# Patient Record
Sex: Female | Born: 2000 | Race: White | Hispanic: No | Marital: Single | Smoking: Former smoker
Health system: Southern US, Community
[De-identification: ages and names within clinical notes are randomized; demographics above are authoritative.]

## PROBLEM LIST (undated history)

## (undated) ENCOUNTER — Ambulatory Visit: Admission: EM | Payer: Medicaid Other | Source: Home / Self Care

## (undated) DIAGNOSIS — Z789 Other specified health status: Secondary | ICD-10-CM

## (undated) HISTORY — DX: Other specified health status: Z78.9

---
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
## 2018-07-03 ENCOUNTER — Other Ambulatory Visit: Payer: Self-pay

## 2018-07-03 ENCOUNTER — Encounter: Payer: Self-pay | Admitting: *Deleted

## 2018-07-03 DIAGNOSIS — Z79899 Other long term (current) drug therapy: Secondary | ICD-10-CM | POA: Insufficient documentation

## 2018-07-03 DIAGNOSIS — R1030 Lower abdominal pain, unspecified: Secondary | ICD-10-CM | POA: Diagnosis present

## 2018-07-03 DIAGNOSIS — R102 Pelvic and perineal pain: Secondary | ICD-10-CM | POA: Insufficient documentation

## 2018-07-03 DIAGNOSIS — F17290 Nicotine dependence, other tobacco product, uncomplicated: Secondary | ICD-10-CM | POA: Insufficient documentation

## 2018-07-03 LAB — LIPASE, BLOOD: Lipase: 49 U/L (ref 11–51)

## 2018-07-03 LAB — COMPREHENSIVE METABOLIC PANEL
ALT: 20 U/L (ref 0–44)
ANION GAP: 5 (ref 5–15)
AST: 21 U/L (ref 15–41)
Albumin: 4.3 g/dL (ref 3.5–5.0)
Alkaline Phosphatase: 71 U/L (ref 38–126)
BILIRUBIN TOTAL: 0.5 mg/dL (ref 0.3–1.2)
BUN: 15 mg/dL (ref 6–20)
CO2: 26 mmol/L (ref 22–32)
Calcium: 9.2 mg/dL (ref 8.9–10.3)
Chloride: 109 mmol/L (ref 98–111)
Creatinine, Ser: 0.71 mg/dL (ref 0.44–1.00)
GFR calc Af Amer: >60 mL/min (ref >60)
Glucose, Bld: 82 mg/dL (ref 70–99)
POTASSIUM: 3.3 mmol/L — AB (ref 3.5–5.1)
Sodium: 140 mmol/L (ref 135–145)
TOTAL PROTEIN: 7.9 g/dL (ref 6.5–8.1)

## 2018-07-03 LAB — URINALYSIS, COMPLETE (UACMP) WITH MICROSCOPIC
BILIRUBIN URINE: NEGATIVE
GLUCOSE, UA: NEGATIVE mg/dL
HGB URINE DIPSTICK: NEGATIVE
Ketones, ur: NEGATIVE mg/dL
Leukocytes, UA: NEGATIVE
NITRITE: NEGATIVE
Protein, ur: NEGATIVE mg/dL
SPECIFIC GRAVITY, URINE: 1.003 — AB (ref 1.005–1.030)
pH: 6 (ref 5.0–8.0)

## 2018-07-03 LAB — CBC
HCT: 45.5 % (ref 36.0–46.0)
HEMOGLOBIN: 14.8 g/dL (ref 12.0–15.0)
MCH: 30.8 pg (ref 26.0–34.0)
MCHC: 32.5 g/dL (ref 30.0–36.0)
MCV: 94.8 fL (ref 80.0–100.0)
PLATELETS: 284 10*3/uL (ref 150–400)
RBC: 4.8 MIL/uL (ref 3.87–5.11)
RDW: 12.3 % (ref 11.5–15.5)
WBC: 11.5 10*3/uL — AB (ref 4.0–10.5)
nRBC: 0 % (ref 0.0–0.2)

## 2018-07-03 LAB — HCG, QUANTITATIVE, PREGNANCY

## 2018-07-03 NOTE — ED Triage Notes (Addendum)
Pt reporting generalized abd pain and pelvic pain x 3 months that has remained constant but pt reports she can not take it any longer. Nausea but no vomiting, intermittent diarrhea. Pt reports clear vaginal discharge without odor. No new sexual partners.   When asked about recent period pt reports she had a normal period 3 months ago but has only been spotting intermittently since then. RN asked if pt has taken pregnancy tests and pt reports she had three positive pregnancy tests but then had a negative test at [facility] 1 week ago.

## 2018-07-04 ENCOUNTER — Emergency Department
Admission: EM | Admit: 2018-07-04 | Discharge: 2018-07-04 | Disposition: A | Discharge status: Home / Self Care | Payer: BLUE CROSS/BLUE SHIELD | Attending: Emergency Medicine | Admitting: Emergency Medicine

## 2018-07-04 DIAGNOSIS — R102 Pelvic and perineal pain: Secondary | ICD-10-CM

## 2018-07-04 LAB — CHLAMYDIA/NGC RT PCR (ARMC ONLY)
Chlamydia Tr: NOT DETECTED
N gonorrhoeae: NOT DETECTED

## 2018-07-04 LAB — WET PREP, GENITAL
Clue Cells Wet Prep HPF POC: NONE SEEN
Sperm: NONE SEEN
Trich, Wet Prep: NONE SEEN
Yeast Wet Prep HPF POC: NONE SEEN

## 2018-07-04 MED ORDER — ONDANSETRON 4 MG PO TBDP: ORAL_TABLET | ORAL | 0 refills | Status: DC

## 2018-07-04 MED ORDER — ONDANSETRON 4 MG PO TBDP
4.0000 mg | ORAL_TABLET | Freq: Once | ORAL | Status: AC
  Administered 2018-07-04: 4 mg via ORAL
  Filled 2018-07-04: qty 1

## 2018-07-04 MED ORDER — OXYCODONE-ACETAMINOPHEN 5-325 MG PO TABS
2.0000 | ORAL_TABLET | Freq: Once | ORAL | Status: AC
  Administered 2018-07-04: 2 via ORAL
  Filled 2018-07-04: qty 2

## 2018-07-04 NOTE — ED Notes (Signed)
EDP in with patient 

## 2018-07-04 NOTE — ED Notes (Signed)
Patient states having nausea.

## 2018-07-04 NOTE — ED Notes (Signed)
EDP in with patient to preform pelvic exam with ED tech Dorian.

## 2018-07-04 NOTE — Discharge Instructions (Signed)
You have been seen in the Emergency Department (ED) for pelvic/abdominal pain.  Your evaluation did not identify a clear cause of your symptoms but was generally reassuring.  Please follow up as instructed above regarding todays emergent visit and the symptoms that are bothering you.  Return to the ED if your abdominal pain worsens or fails to improve, you develop bloody vomiting, bloody diarrhea, you are unable to tolerate fluids due to vomiting, fever greater than 101, or other symptoms that concern you.

## 2018-07-04 NOTE — ED Provider Notes (Signed)
[facility] Emergency Department Provider Note  ____________________________________________   First MD Initiated Contact with Patient 07/04/18 0103     (approximate)  I have reviewed the triage vital signs and the nursing notes.   HISTORY  Chief Complaint Abdominal Pain    HPI Robin GasserRebecca L Oriordan is a 18 y.o. female who presents for evaluation of about 3 months of intermittent generalized lower abdominal pain.  She says nothing in particular makes it better or worse but it comes and goes and tonight it was severe.  She has had nausea but no vomiting.  Occasional loose stools.  She has had some clear vaginal discharge but no acute abnormalities.  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  She has had intermittent and irregular periods for months and was uncertain if she could be pregnant tonight.  She reports that the pain is currently severe and the nausea is severe.  She denies fever/chills, chest pain, shortness of breath, dysuria, and recent vaginal bleeding.  History reviewed. No pertinent past medical history.  Patient Active Problem List   Diagnosis Date Noted  . Elevated blood-pressure reading without diagnosis of hypertension 12/02/2016    History reviewed. No pertinent surgical history.  Prior to Admission medications   Medication Sig Start Date End Date Taking? Authorizing Provider  dicyclomine (BENTYL) 20 MG tablet Take 1 tablet (20 mg total) by mouth 3 (three) times daily as needed for spasms. Patient not taking: Reported on 07/04/2018 10/22/17 10/22/18  Myrna BlazerSchaevitz, David Matthew, MD  etonogestrel (IMPLANON) 68 MG IMPL implant Inject into the skin.    [provider]  fluconazole (DIFLUCAN) 150 MG tablet Take 1 tablet (150 mg total) by mouth daily. Patient not taking: Reported on 07/04/2018 02/24/16   Payton Mccallumonty, [place], MD  metroNIDAZOLE (FLAGYL) 500 MG tablet   03/19/17   [provider]  nitrofurantoin, macrocrystal-monohydrate, (MACROBID) 100 MG capsule  05/15/17   [provider]  ondansetron (ZOFRAN ODT) 4 MG disintegrating tablet Allow 1-2 tablets to dissolve in your mouth every 8 hours as needed for nausea/vomiting 07/04/18   Loleta RoseForbach, Aleshia Cartelli, MD  ondansetron (ZOFRAN) 4 MG tablet Take 1 tablet (4 mg total) by mouth daily as needed. Patient not taking: Reported on 07/04/2018 10/22/17   Schaevitz, Myra Rudeavid Matthew, MD  potassium chloride SA (KLOR-CON M20) 20 MEQ tablet Take 1 tablet (20 mEq total) by mouth daily. Patient not taking: Reported on 07/04/2018 05/19/17   Loleta RoseForbach, Jara Feider, MD    Allergies Patient has no known allergies.  History reviewed. No pertinent family history.  Social History Social History   Tobacco Use  . Smoking status: Current Every Day Smoker    Types: E-cigarettes  . Smokeless tobacco: Never Used  Substance Use Topics  . Alcohol use: No  . Drug use: No    Review of Systems Constitutional: No fever/chills Eyes: No visual changes. ENT: No sore throat. Cardiovascular: Denies chest pain. Respiratory: Denies shortness of breath. Gastrointestinal: Persistent lower abdominal pain for 3 months as described above.  Nausea but no vomiting.  Intermittent loose stools. Genitourinary: Negative for dysuria. Musculoskeletal: Negative for neck pain.  Negative for back pain. Integumentary: Negative for rash. Neurological: Negative for headaches, focal weakness or numbness.   ____________________________________________   PHYSICAL EXAM:  VITAL SIGNS: ED Triage Vitals [07/03/18 2044]  Enc Vitals Group     BP 140/74     Pulse Rate 95  Resp 16     Temp 97.8 F (36.6 C)     Temp Source Oral     SpO2 99 %     Weight (!) 145.2 kg (320 lb)     Height 1.676 m (5\' 6" )     Head Circumference      Peak Flow      Pain Score 10     Pain Loc      Pain Edu?      Excl. in GC?     Constitutional: Alert and  oriented. Well appearing and in no acute distress. Eyes: Conjunctivae are normal.  Head: Atraumatic. Nose: No congestion/rhinnorhea. Mouth/Throat: Mucous membranes are moist. Neck: No stridor.  No meningeal signs.   Cardiovascular: Normal rate, regular rhythm. Good peripheral circulation. Grossly normal heart sounds. Respiratory: Normal respiratory effort.  No retractions. Lungs CTAB. Gastrointestinal: Soft with mild diffuse tenderness to palpation throughout the lower abdomen but no rebound or guarding, no peritonitis, no distention.  Exam may be somewhat limited by body habitus. Genitourinary: Normal external genital exam with no herpetic lesions.  Normal speculum exam with an appropriate amount of vaginal discharge, no cervicitis, no cervical motion tenderness.  ED chaperone present throughout exam. Musculoskeletal: No lower extremity tenderness nor edema. No gross deformities of extremities. Neurologic:  Normal speech and language. No gross focal neurologic deficits are appreciated.  Skin:  Skin is warm, dry and intact. No rash noted. Psychiatric: Mood and affect are normal. Speech and behavior are normal.  ____________________________________________   LABS (all labs ordered are listed, but only abnormal results are displayed)  Labs Reviewed  WET PREP, GENITAL - Abnormal; Notable for the following components:      Result Value   WBC, Wet Prep HPF POC RARE (*)    All other components within normal limits  COMPREHENSIVE METABOLIC PANEL - Abnormal; Notable for the following components:   Potassium 3.3 (*)    All other components within normal limits  CBC - Abnormal; Notable for the following components:   WBC 11.5 (*)    All other components within normal limits  URINALYSIS, COMPLETE (UACMP) WITH MICROSCOPIC - Abnormal; Notable for the following components:   Color, Urine COLORLESS (*)    APPearance CLEAR (*)    Specific Gravity, Urine 1.003 (*)    Bacteria, UA RARE (*)    All  other components within normal limits  CHLAMYDIA/NGC RT PCR ([facility] ONLY)  LIPASE, BLOOD  HCG, QUANTITATIVE, PREGNANCY  HIV ANTIBODY (ROUTINE TESTING W REFLEX)   ____________________________________________  EKG  No indication for EKG ____________________________________________  RADIOLOGY   ED MD interpretation:    Official radiology report(s): No results found.  ____________________________________________   PROCEDURES  Critical Care performed: No   Procedure(s) performed:   Procedures   ____________________________________________   INITIAL IMPRESSION / ASSESSMENT AND PLAN / ED COURSE  As part of my medical decision making, I reviewed the following data within the electronic MEDICAL RECORD NUMBER Nursing notes reviewed and incorporated, Labs reviewed , Old chart reviewed and Notes from prior ED visits    Differential diagnosis includes, but is not limited to, endometriosis, ovarian cyst, STD/PID, UTI/pyelonephritis, diverticulitis, appendicitis, ovarian torsion.  The patient has had symptoms for months and although it is worse tonight it did not appear to be acute in onset or severe enough to suggest torsion (the patient is sitting up in bed and texting on her phone).  I have ordered Percocet 2 tablets by mouth and Zofran 4 mg ODT p.o.  Given that she has had a positive herpes diagnosis in the past and has been having persistent lower abdominal symptoms it is reasonable to perform a pelvic exam and I am checking a wet prep, GC/chlamydia, and rapid HIV.  Her external abdominal exam is reassuring with some diffuse tenderness but no focal tenderness and no peritonitis.  I do not think she would benefit from a CT scan at this point.  I will reassess after the pelvic exam but she may benefit from outpatient follow-up with OB/GYN.  Clinical Course as of Jul 04 712  Fri Jul 04, 2018  5974 No evidence of acute abnormality on wet prep.  The patient does not want to wait  for the gonorrhea and chlamydia which I think is appropriate given a lack of cervicitis or cervical motion tenderness.  She will follow-up with GYN and I gave my usual customary return precautions.  Wet prep, genital(!) [CF]  [id number] Negative chlamydia/GC.  Chlamydia/NGC rt PCR ([facility] only) [CF]    Clinical Course User Index [CF] Loleta Rose, MD    ____________________________________________  FINAL CLINICAL IMPRESSION(S) / ED DIAGNOSES  Final diagnoses:  Pelvic pain     MEDICATIONS GIVEN DURING THIS VISIT:  Medications  oxyCODONE-acetaminophen (PERCOCET/ROXICET) 5-325 MG per tablet 2 tablet (2 tablets Oral Given 07/04/18 0136)  ondansetron (ZOFRAN-ODT) disintegrating tablet 4 mg (4 mg Oral Given 07/04/18 0136)     ED Discharge Orders         Ordered    ondansetron (ZOFRAN ODT) 4 MG disintegrating tablet     07/04/18 0340           Note:  This document was prepared using Dragon voice recognition software and may include unintentional dictation errors.    Loleta Rose, MD 07/04/18 [phone]

## 2018-07-04 NOTE — ED Notes (Signed)
Patient states having abd and pelvic pain since Oct. Went to [facility] two weeks ago and gave preg.test was neg. Patient states took preg test in dec 2019 and they was positive. Patient has been spotting for 2 weeks. States when goes to bathroom wipes there is small amount of red blood then will wipe again there is none. Patient states pain in upper abdominal and sometime lower in pelvis.

## 2018-07-05 ENCOUNTER — Encounter: Payer: Self-pay | Admitting: Emergency Medicine

## 2018-07-05 ENCOUNTER — Emergency Department
Admission: EM | Admit: 2018-07-05 | Discharge: 2018-07-05 | Disposition: A | Discharge status: Home / Self Care | Payer: BLUE CROSS/BLUE SHIELD | Attending: Emergency Medicine | Admitting: Emergency Medicine

## 2018-07-05 ENCOUNTER — Other Ambulatory Visit: Payer: Self-pay

## 2018-07-05 DIAGNOSIS — Z79899 Other long term (current) drug therapy: Secondary | ICD-10-CM | POA: Insufficient documentation

## 2018-07-05 DIAGNOSIS — K2901 Acute gastritis with bleeding: Secondary | ICD-10-CM | POA: Insufficient documentation

## 2018-07-05 DIAGNOSIS — F17290 Nicotine dependence, other tobacco product, uncomplicated: Secondary | ICD-10-CM | POA: Diagnosis not present

## 2018-07-05 DIAGNOSIS — K920 Hematemesis: Secondary | ICD-10-CM | POA: Diagnosis present

## 2018-07-05 DIAGNOSIS — R1084 Generalized abdominal pain: Secondary | ICD-10-CM

## 2018-07-05 LAB — COMPREHENSIVE METABOLIC PANEL
ALT: 22 U/L (ref 0–44)
AST: 26 U/L (ref 15–41)
Albumin: 4 g/dL (ref 3.5–5.0)
Alkaline Phosphatase: 64 U/L (ref 38–126)
Anion gap: 8 (ref 5–15)
BUN: 20 mg/dL (ref 6–20)
CO2: 25 mmol/L (ref 22–32)
CREATININE: 0.78 mg/dL (ref 0.44–1.00)
Calcium: 9.1 mg/dL (ref 8.9–10.3)
Chloride: 104 mmol/L (ref 98–111)
GFR calc non Af Amer: >60 mL/min (ref >60)
Glucose, Bld: 104 mg/dL — ABNORMAL HIGH (ref 70–99)
Potassium: 4.3 mmol/L (ref 3.5–5.1)
Sodium: 137 mmol/L (ref 135–145)
Total Bilirubin: 0.9 mg/dL (ref 0.3–1.2)
Total Protein: 7.6 g/dL (ref 6.5–8.1)

## 2018-07-05 LAB — LIPASE, BLOOD: Lipase: 31 U/L (ref 11–51)

## 2018-07-05 LAB — CBC WITH DIFFERENTIAL/PLATELET
Abs Immature Granulocytes: 0.03 10*3/uL (ref 0.00–0.07)
Basophils Absolute: 0 10*3/uL (ref 0.0–0.1)
Basophils Relative: 0 %
Eosinophils Absolute: 0.1 10*3/uL (ref 0.0–0.5)
Eosinophils Relative: 0 %
HCT: 47.2 % — ABNORMAL HIGH (ref 36.0–46.0)
Hemoglobin: 15.3 g/dL — ABNORMAL HIGH (ref 12.0–15.0)
Immature Granulocytes: 0 %
LYMPHS ABS: 0.6 10*3/uL — AB (ref 0.7–4.0)
Lymphocytes Relative: 6 %
MCH: 30.7 pg (ref 26.0–34.0)
MCHC: 32.4 g/dL (ref 30.0–36.0)
MCV: 94.8 fL (ref 80.0–100.0)
Monocytes Absolute: 1 10*3/uL (ref 0.1–1.0)
Monocytes Relative: 8 %
Neutro Abs: 10 10*3/uL — ABNORMAL HIGH (ref 1.7–7.7)
Neutrophils Relative %: 86 %
Platelets: 246 10*3/uL (ref 150–400)
RBC: 4.98 MIL/uL (ref 3.87–5.11)
RDW: 12.4 % (ref 11.5–15.5)
WBC: 11.7 10*3/uL — ABNORMAL HIGH (ref 4.0–10.5)
nRBC: 0 % (ref 0.0–0.2)

## 2018-07-05 LAB — HIV ANTIBODY (ROUTINE TESTING W REFLEX): HIV Screen 4th Generation wRfx: NONREACTIVE

## 2018-07-05 MED ORDER — ONDANSETRON 4 MG PO TBDP
4.0000 mg | ORAL_TABLET | Freq: Once | ORAL | Status: AC
  Administered 2018-07-05: 4 mg via ORAL
  Filled 2018-07-05: qty 1

## 2018-07-05 MED ORDER — OMEPRAZOLE 40 MG PO CPDR: 40.0000 mg | DELAYED_RELEASE_CAPSULE | Freq: Every day | ORAL | 0 refills | Status: DC

## 2018-07-05 MED ORDER — ALUM & MAG HYDROXIDE-SIMETH 200-200-20 MG/5ML PO SUSP
30.0000 mL | Freq: Once | ORAL | Status: AC
  Administered 2018-07-05: 30 mL via ORAL
  Filled 2018-07-05: qty 30

## 2018-07-05 MED ORDER — PANTOPRAZOLE SODIUM 40 MG PO TBEC
40.0000 mg | DELAYED_RELEASE_TABLET | Freq: Every day | ORAL | Status: DC
  Administered 2018-07-05: 40 mg via ORAL
  Filled 2018-07-05: qty 1

## 2018-07-05 MED ORDER — LIDOCAINE VISCOUS HCL 2 % MT SOLN
15.0000 mL | Freq: Once | OROMUCOSAL | Status: AC
  Administered 2018-07-05: 15 mL via OROMUCOSAL
  Filled 2018-07-05: qty 15

## 2018-07-05 NOTE — ED Triage Notes (Signed)
Pt to ED via POV c/o hematemesis. Pt states that this started this morning around 0130. Pt reports that she has had approximately 8 episodes of vomiting. Pt was seen last night and states that she was given 2 Percocet. Pt is in NAD at this time.

## 2018-07-05 NOTE — ED Notes (Signed)
Pt ambulatory to bathroom with steady gait noted.

## 2018-07-05 NOTE — ED Provider Notes (Addendum)
[facility] Emergency Department Provider Note  ____________________________________________   First MD Initiated Contact with Patient 07/05/18 [id number]     (approximate)  I have reviewed the triage vital signs and the nursing notes.   HISTORY  Chief Complaint Hematemesis   HPI Robin Ward is a 18 y.o. female with a history of chronic abdominal pain with nausea vomiting who is presenting emergency department today with vomiting blood.  She says that she had bloody "chunks" mixed in with an otherwise yellow vomitus.  She says that she has vomited 6-7 times today.  However, says the last episode was at about 7 AM.  Was just seen in the emergency department on 17 January, yesterday, for chronic abdominal pain.  Says that the pain is been ongoing for months.  No associated diarrhea.  Does not take any medications at home.  Has been seen by mostly emergency department providers and has had one outpatient visit with a primary care doctor but has not followed up with GI.  Says that the abdominal pain is mostly to the upper abdomen and feels like a cramping which occasionally radiates through to the back.  Denies any chest pain or shortness of breath.  Patient does not report black or bloody stools.   History reviewed. No pertinent past medical history.  Patient Active Problem List   Diagnosis Date Noted  . Elevated blood-pressure reading without diagnosis of hypertension 12/02/2016    History reviewed. No pertinent surgical history.  Prior to Admission medications   Medication Sig Start Date End Date Taking? Authorizing Provider  dicyclomine (BENTYL) 20 MG tablet Take 1 tablet (20 mg total) by mouth 3 (three) times daily as needed for spasms. Patient not taking: Reported on 07/04/2018 10/22/17 10/22/18  Myrna BlazerSchaevitz, Sondos Wolfman Matthew, MD  etonogestrel (IMPLANON) 68 MG IMPL implant Inject into the skin.    [provider]  fluconazole (DIFLUCAN) 150 MG tablet Take  1 tablet (150 mg total) by mouth daily. Patient not taking: Reported on 07/04/2018 02/24/16   Payton Mccallumonty, [place], MD  metroNIDAZOLE (FLAGYL) 500 MG tablet  03/19/17   [provider]  nitrofurantoin, macrocrystal-monohydrate, (MACROBID) 100 MG capsule  05/15/17   [provider]  ondansetron (ZOFRAN ODT) 4 MG disintegrating tablet Allow 1-2 tablets to dissolve in your mouth every 8 hours as needed for nausea/vomiting 07/04/18   Loleta RoseForbach, Cory, MD  ondansetron (ZOFRAN) 4 MG tablet Take 1 tablet (4 mg total) by mouth daily as needed. Patient not taking: Reported on 07/04/2018 10/22/17   Tahj Njoku, Myra Rudeavid Matthew, MD  potassium chloride SA (KLOR-CON M20) 20 MEQ tablet Take 1 tablet (20 mEq total) by mouth daily. Patient not taking: Reported on 07/04/2018 05/19/17   Loleta RoseForbach, Cory, MD    Allergies Patient has no known allergies.  No family history on file.  Social History Social History   Tobacco Use  . Smoking status: Current Every Day Smoker    Types: E-cigarettes  . Smokeless tobacco: Never Used  Substance Use Topics  . Alcohol use: No  . Drug use: No    Review of Systems  Constitutional: No fever/chills Eyes: No visual changes. ENT: No sore throat. Cardiovascular: Denies chest pain. Respiratory: Denies shortness of breath. Gastrointestinal:  No diarrhea.  No constipation. Genitourinary: Negative for dysuria. Musculoskeletal: Negative for back pain. Skin: Negative for rash. Neurological: Negative for headaches, focal weakness or numbness.   ____________________________________________   PHYSICAL EXAM:  VITAL SIGNS: ED Triage Vitals  Enc Vitals Group  BP 07/05/18 0945 112/70     Pulse Rate 07/05/18 0942 86     Resp 07/05/18 0942 16     Temp 07/05/18 0942 98 F (36.7 C)     Temp Source 07/05/18 0942 Oral     SpO2 07/05/18 0942 97 %     Weight 07/05/18 0943 (!) 320 lb (145.2 kg)     Height 07/05/18 0943 5\' 6"  (1.676 m)     Head Circumference --      Peak  Flow --      Pain Score 07/05/18 0943 5     Pain Loc --      Pain Edu? --      Excl. in GC? --     Constitutional: Alert and oriented. Well appearing and in no acute distress. Eyes: Conjunctivae are normal.  Head: Atraumatic. Nose: No congestion/rhinnorhea. Mouth/Throat: Mucous membranes are moist.  Neck: No stridor.   Cardiovascular: Normal rate, regular rhythm. Grossly normal heart sounds.   Respiratory: Normal respiratory effort.  No retractions. Lungs CTAB. Gastrointestinal: Soft with very mild and generalized tenderness to palpation.  However, upper abdominal pain is slightly worse than the lower.  Negative Murphy sign.  No rebound or guarding no distention.  Musculoskeletal: No lower extremity tenderness nor edema.  No joint effusions. Neurologic:  Normal speech and language. No gross focal neurologic deficits are appreciated. Skin:  Skin is warm, dry and intact. No rash noted. Psychiatric: Mood and affect are normal. Speech and behavior are normal.  ____________________________________________   LABS (all labs ordered are listed, but only abnormal results are displayed)  Labs Reviewed  CBC WITH DIFFERENTIAL/PLATELET - Abnormal; Notable for the following components:      Result Value   WBC 11.7 (*)    Hemoglobin 15.3 (*)    HCT 47.2 (*)    Neutro Abs 10.0 (*)    Lymphs Abs 0.6 (*)    All other components within normal limits  COMPREHENSIVE METABOLIC PANEL - Abnormal; Notable for the following components:   Glucose, Bld 104 (*)    All other components within normal limits  LIPASE, BLOOD   ____________________________________________  EKG   ____________________________________________  RADIOLOGY   ____________________________________________   PROCEDURES  Procedure(s) performed:   Procedures  Critical Care performed:   ____________________________________________   INITIAL IMPRESSION / ASSESSMENT AND PLAN / ED COURSE  Pertinent labs & imaging  results that were available during my care of the patient were reviewed by me and considered in my medical decision making (see chart for details).  Differential diagnosis includes, but is not limited to, biliary disease (biliary colic, acute cholecystitis, cholangitis, choledocholithiasis, etc), intrathoracic causes for epigastric abdominal pain including ACS, gastritis, duodenitis, pancreatitis, small bowel or large bowel obstruction, abdominal aortic aneurysm, hernia, and ulcer(s). As part of my medical decision making, I reviewed the following data within the electronic MEDICAL RECORD NUMBER Notes from prior ED visits  ----------------------------------------- 12:15 PM on 07/05/2018 -----------------------------------------  Patient given Maalox as well as lidocaine and says that the pain is improved.  I re-palpate her abdomen and she has soft and nontender throughout.  Will discharge with omeprazole.  I recommended strongly that the patient follow-up with gastroenterology to do further testing for things like H. pylori.  Do not feel the patient has appendicitis.  It seems that she has a baseline low-grade elevation in her white blood cell count.  No focal lower quadrant tenderness to palpation.  Pain relieved with antacid medications. ____________________________________________   FINAL CLINICAL  IMPRESSION(S) / ED DIAGNOSES  Abdominal pain.  Gastritis.  NEW MEDICATIONS STARTED DURING THIS VISIT:  New Prescriptions   No medications on file     Note:  This document was prepared using Dragon voice recognition software and may include unintentional dictation errors.     Myrna BlazerSchaevitz, Mieka Leaton Matthew, MD 07/05/18 1216    Pershing ProudSchaevitz, Myra Rudeavid Matthew, MD 07/05/18 [phone]

## 2018-07-08 ENCOUNTER — Ambulatory Visit (INDEPENDENT_AMBULATORY_CARE_PROVIDER_SITE_OTHER): Payer: BLUE CROSS/BLUE SHIELD | Admitting: Obstetrics and Gynecology

## 2018-07-08 ENCOUNTER — Encounter: Payer: Self-pay | Admitting: Obstetrics and Gynecology

## 2018-07-08 VITALS — BP 118/68 | Ht 66.0 in | Wt 325.8 lb

## 2018-07-08 DIAGNOSIS — N938 Other specified abnormal uterine and vaginal bleeding: Secondary | ICD-10-CM

## 2018-07-08 DIAGNOSIS — Z30011 Encounter for initial prescription of contraceptive pills: Secondary | ICD-10-CM

## 2018-07-08 MED ORDER — DESOGESTREL-ETHINYL ESTRADIOL 0.15-0.02/0.01 MG (21/5) PO TABS: 1.0000 | ORAL_TABLET | Freq: Every day | ORAL | 1 refills | Status: DC

## 2018-07-08 NOTE — Progress Notes (Signed)
Patient comes in today for follow up from ED. Patient states that she is having abdominal cramping and spotting off and on for about three weeks. She has not had cycle in three months. LMP is unknown.

## 2018-07-08 NOTE — Progress Notes (Signed)
HPI:      [name] is a 18 y.o. No obstetric history on file. who LMP was No LMP recorded.  Subjective:   She presents today after being seen in the emergency department for abdominal pain and having several weeks of spotting. She states that she had Implanon and it was removed in September.  She reports a normal menses in October then November December and January she has had only spotting which has been irregular and not at the regular time of her menses. Patient was sexually active in November without birth control however her quantitative beta-hCG emergency department was negative.  She reports that she is not currently sexually active and does not desire birth control.  She is not desirous of pregnancy at this time.    Hx: The following portions of the patient's history were reviewed and updated as appropriate:             She  has no past medical history on file. She does not have any pertinent problems on file. She  has no past surgical history on file. Her family history is not on file. She  reports that she has been smoking e-cigarettes. She has never used smokeless tobacco. She reports that she does not drink alcohol or use drugs. She has a current medication list which includes the following prescription(s): omeprazole, ondansetron, and desogestrel-ethinyl estradiol. She has No Known Allergies.       Review of Systems:  Review of Systems  Constitutional: Denied constitutional symptoms, night sweats, recent illness, fatigue, fever, insomnia and weight loss.  Eyes: Denied eye symptoms, eye pain, photophobia, vision change and visual disturbance.  Ears/Nose/Throat/Neck: Denied ear, nose, throat or neck symptoms, hearing loss, nasal discharge, sinus congestion and sore throat.  Cardiovascular: Denied cardiovascular symptoms, arrhythmia, chest pain/pressure, edema, exercise intolerance, orthopnea and palpitations.  Respiratory: Denied pulmonary symptoms, asthma, pleuritic  pain, productive sputum, cough, dyspnea and wheezing.  Gastrointestinal: Denied, gastro-esophageal reflux, melena, nausea and vomiting.  Genitourinary: Denied genitourinary symptoms including symptomatic vaginal discharge, pelvic relaxation issues, and urinary complaints.  Musculoskeletal: Denied musculoskeletal symptoms, stiffness, swelling, muscle weakness and myalgia.  Dermatologic: Denied dermatology symptoms, rash and scar.  Neurologic: Denied neurology symptoms, dizziness, headache, neck pain and syncope.  Psychiatric: Denied psychiatric symptoms, anxiety and depression.  Endocrine: Denied endocrine symptoms including hot flashes and night sweats.   Meds:   Current Outpatient Medications on File Prior to Visit  Medication Sig Dispense Refill  . omeprazole (PRILOSEC) 40 MG capsule Take 1 capsule (40 mg total) by mouth daily. 30 capsule 0  . ondansetron (ZOFRAN ODT) 4 MG disintegrating tablet Allow 1-2 tablets to dissolve in your mouth every 8 hours as needed for nausea/vomiting 30 tablet 0   No current facility-administered medications on file prior to visit.     Objective:     Vitals:   07/08/18 0822  BP: 118/68              Emergency department records and beta-hCG reviewed directly with the patient.  Previous CT reviewed with the patient.  Assessment:    No obstetric history on file. Patient Active Problem List   Diagnosis Date Noted  . Elevated blood-pressure reading without diagnosis of hypertension 12/02/2016     1. DUB (dysfunctional uterine bleeding)   2. Initiation of OCP (BCP)     After discontinuation of Implanon patient began having irregular cycles or no cycles at all which were only spotting.  Possibility of PCO.  Plan:            1.  Recommend cycling on OCPs for 2 cycles and then patient to discontinue OCPs and expect that her normal menses will return.  If her normal menses did not return consider PCO work-up at that time. Orders No orders of the  defined types were placed in this encounter.    Meds ordered this encounter  Medications  . desogestrel-ethinyl estradiol (MIRCETTE) 0.15-0.02/0.01 MG (21/5) tablet    Sig: Take 1 tablet by mouth at bedtime.    Dispense:  1 Package    Refill:  1      F/U  Return in about 4 months (around 11/06/2018). I spent 22 minutes involved in the care of this patient of which greater than 50% was spent discussing emergency department visit and work-up, irregular cycles, use of birth control as well as hormonal cycle control, future pregnancy, possible PCO.  All questions answered.  Elonda Husky, M.D. 07/08/2018 9:07 AM

## 2019-09-01 ENCOUNTER — Ambulatory Visit (INDEPENDENT_AMBULATORY_CARE_PROVIDER_SITE_OTHER): Payer: Self-pay

## 2019-09-01 ENCOUNTER — Ambulatory Visit
Admission: EM | Admit: 2019-09-01 | Discharge: 2019-09-01 | Disposition: A | Discharge status: Home / Self Care | Payer: Self-pay | Attending: Urgent Care | Admitting: Urgent Care

## 2019-09-01 ENCOUNTER — Other Ambulatory Visit: Payer: Self-pay

## 2019-09-01 DIAGNOSIS — W19XXXA Unspecified fall, initial encounter: Secondary | ICD-10-CM

## 2019-09-01 DIAGNOSIS — R0789 Other chest pain: Secondary | ICD-10-CM

## 2019-09-01 DIAGNOSIS — R0781 Pleurodynia: Secondary | ICD-10-CM

## 2019-09-01 DIAGNOSIS — S20212A Contusion of left front wall of thorax, initial encounter: Secondary | ICD-10-CM

## 2019-09-01 DIAGNOSIS — W010XXA Fall on same level from slipping, tripping and stumbling without subsequent striking against object, initial encounter: Secondary | ICD-10-CM

## 2019-09-01 NOTE — ED Triage Notes (Signed)
Slipped on wet floor, falling onto left side this morning.  C/o lower left rib and left elbow pain.  Rib pain worse with inspiration.  Did not head, no LOC.

## 2019-09-01 NOTE — Discharge Instructions (Addendum)
It was very nice seeing you today in clinic. Thank you for entrusting me with your care.   Ice as tolerated. Cough and take deep breaths often to promote full expansion of your lungs. May use Tylenol and/or Ibuprofen as needed for pain.   Make arrangements to follow up with your regular doctor in 1 week for re-evaluation if not improving. If your symptoms/condition worsens, please seek follow up care either here or in the ER. Please remember, our [facility] providers are "right here with you" when you need [place].   Again, it was my pleasure to take care of you today. Thank you for choosing our clinic. I hope that you start to feel better quickly.   Quentin Mulling, MSN, APRN, FNP-C, CEN Advanced Practice Provider [facility]

## 2019-09-03 NOTE — ED Provider Notes (Signed)
[place], [place]   Name: Robin Ward DOB: [birth year] MRN: [id number] CSN: [id number] PCP: [facility]  Arrival date and time:  09/01/19 1248  Chief Complaint:  Fall, Rib pain, and Elbow Pain  NOTE: Prior to seeing the patient today, I have reviewed the triage nursing documentation and vital signs. Clinical staff has updated patient's PMH/PSHx, current medication list, and drug allergies/intolerances to ensure comprehensive history available to assist in medical decision making.   History:   HPI: FLORRIE Ward is a 19 y.o. female who presents today with complaints of pain in her LEFT lateral torso (ribs) following a mechanical fall. Injury occurred approximately 45 minutes PTA when patient slipped on a wet floor causing her to fall onto her LEFT side and elbow. Patient did not hit her head when she fell; no LOC. Patient notes that pain is exacerbated by deep inspiration. She denies shortness of breath. Patient with minimal pain in her LEFT elbow as well. She notes FROM of the elbow with intact distal sensation. She denies previous injuries to her ribs or elbow. Due to the acute nature of the events leading to today's urgent care visit, patient has not taken any over the counter interventions to help with her pain.   History reviewed. No pertinent past medical history.  History reviewed. No pertinent surgical history.  History reviewed. No pertinent family history.  Social History   Tobacco Use  . Smoking status: Current Every Day Smoker    Packs/day: 1.00    Years: 2.00    Pack years: 2.00    Types: E-cigarettes, Cigarettes  . Smokeless tobacco: Never Used  Substance Use Topics  . Alcohol use: No  . Drug use: No    Patient Active Problem List   Diagnosis Date Noted  . Elevated blood-pressure reading without diagnosis of hypertension 12/02/2016    Home Medications:    No outpatient medications have been marked as taking for the 09/01/19 encounter  [facility] Encounter).    Allergies:   Patient has no known allergies.  Review of Systems (ROS):  Review of systems NEGATIVE unless otherwise noted in narrative H&P section.   Vital Signs: Today's Vitals   09/01/19 1258 09/01/19 1304 09/01/19 1428  BP:  (!) 141/69   Pulse:  85   Resp:  18   Temp:  98.2 F (36.8 C)   TempSrc:  Oral   SpO2:  97%   PainSc: 5   5     Physical Exam: Physical Exam  Constitutional: She is oriented to person, place, and time and well-developed, well-nourished, and in no distress.  HENT:  Head: Normocephalic and atraumatic.  Eyes: Pupils are equal, round, and reactive to light.  Cardiovascular: Normal rate, regular rhythm, normal heart sounds and intact distal pulses.  Pulmonary/Chest: Effort normal and breath sounds normal. She exhibits tenderness (LEFT lateral). She exhibits no crepitus, no deformity and no retraction.  Musculoskeletal:     Left elbow: No swelling, deformity or effusion. Normal range of motion. Tenderness (mild tenderness with minimal ecchymosis) present.     Cervical back: Full passive range of motion without pain.  Neurological: She is alert and oriented to person, place, and time. She has normal sensation, normal strength and normal reflexes. Gait normal.  Skin: Skin is warm and dry. No rash noted. She is not diaphoretic.  Psychiatric: Mood, memory, affect and judgment normal.  Nursing note and vitals reviewed.   Urgent Care Treatments / Results:   Orders Placed This Encounter  Procedures  . DG Ribs Unilateral W/Chest Left    LABS: PLEASE NOTE: all labs that were ordered this encounter are listed, however only abnormal results are displayed. Labs Reviewed - No data to display  EKG: -None  RADIOLOGY: DG Ribs Unilateral W/Chest Left  Result Date: 09/01/2019 CLINICAL DATA:  Pain following fall EXAM: LEFT RIBS AND CHEST - 3+ VIEW COMPARISON:  Chest radiograph May 18, 2017 FINDINGS: Frontal chest as well as oblique  and cone-down rib images were obtained. The lungs are clear. The heart size and pulmonary vascularity are normal. No adenopathy. There is no evident pneumothorax or pleural effusion. No rib fracture evident. IMPRESSION: No evident rib fracture.  Lungs clear. Electronically Signed   By: Bretta Bang III M.D.   On: 09/01/2019 13:55    PROCEDURES: Procedures  MEDICATIONS RECEIVED THIS VISIT: Medications - No data to display  PERTINENT CLINICAL COURSE NOTES/UPDATES:   Initial Impression / Assessment and Plan / Urgent Care Course:  Pertinent labs & imaging results that were available during my care of the patient were personally reviewed by me and considered in my medical decision making (see lab/imaging section of note for values and interpretations).  Robin Ward is a 19 y.o. female who presents to [facility] today with complaints of Fall, Rib pain, and Elbow Pain  Patient is well appearing overall in clinic today. She does not appear to be in any acute distress. Presenting symptoms (see HPI) and exam as documented above. Exam reveals tenderness to LEFT lateral chest wall. Pain increases with deep inspiration. No SOB; SPO2 97% on RA. Elbow with FROM and intact PMS distally. Will obtain diagnostic plain films of the chest with dedicated rib series. Patient wishes to defer elbow imaging. Given exam, this is reasonable.    Diagnostic radiographs of the chest with dedicated LEFT rib series revealed no acute abnormalities; no fracture, dislocation, pleural effusion, or PTX.   Given MOI and location of pain, suspect chest wall contusion, which I reviewed with her could be painful. Discussed splinting with position changes, coughing, and deep breathing to minimize the pain associated with these activities. Patient encouraged to perform TCDB exercises to promote full lung expansion in efforts to prevent pneumonia development secondary to hypoventilation.  May use APAP and/or IBU as  needed for pain.  Encouraged ice application as tolerated.   Discussed follow up with primary care physician in 1 week for re-evaluation. I have reviewed the follow up and strict return precautions for any new or worsening symptoms. Patient is aware of symptoms that would be deemed urgent/emergent, and would thus require further evaluation either here or in the emergency department. At the time of discharge, she verbalized understanding and consent with the discharge plan as it was reviewed with her. All questions were fielded by provider and/or clinic staff prior to patient discharge.    Final Clinical Impressions / Urgent Care Diagnoses:   Final diagnoses:  Chest wall contusion, left, initial encounter  Rib pain on left side  Fall due to wet surface, initial encounter    New Prescriptions:  [place] Controlled Substance Registry consulted? Not Applicable  No orders of the defined types were placed in this encounter.   Recommended Follow up Care:  Patient encouraged to follow up with the following provider within the specified time frame, or sooner as dictated by the severity of her symptoms. As always, she was instructed that for any urgent/emergent care needs, she should seek care either here or in the  emergency department for more immediate evaluation.  Follow-up Information    [place] In 1 week.   Why: General reassessment of symptoms if not improving Contact information: [address] [place] [address] [place] [address] [phone]         NOTE: This note was prepared using Dragon dictation software along with smaller phrase technology. Despite my best ability to proofread, there is the potential that transcriptional errors may still occur from this process, and are completely unintentional.    Karen Kitchens, NP 09/03/19 [phone]

## 2019-10-17 ENCOUNTER — Other Ambulatory Visit: Payer: Self-pay

## 2019-10-17 ENCOUNTER — Emergency Department: Payer: Self-pay

## 2019-10-17 ENCOUNTER — Observation Stay
Admission: EM | Admit: 2019-10-17 | Discharge: 2019-10-18 | Disposition: A | Discharge status: Home / Self Care | Payer: Self-pay | Attending: General Surgery | Admitting: General Surgery

## 2019-10-17 DIAGNOSIS — R1031 Right lower quadrant pain: Secondary | ICD-10-CM

## 2019-10-17 DIAGNOSIS — K3580 Unspecified acute appendicitis: Principal | ICD-10-CM | POA: Diagnosis present

## 2019-10-17 DIAGNOSIS — F17290 Nicotine dependence, other tobacco product, uncomplicated: Secondary | ICD-10-CM | POA: Insufficient documentation

## 2019-10-17 DIAGNOSIS — Z20822 Contact with and (suspected) exposure to covid-19: Secondary | ICD-10-CM | POA: Insufficient documentation

## 2019-10-17 DIAGNOSIS — Z6841 Body Mass Index (BMI) 40.0 and over, adult: Secondary | ICD-10-CM | POA: Insufficient documentation

## 2019-10-17 DIAGNOSIS — F17210 Nicotine dependence, cigarettes, uncomplicated: Secondary | ICD-10-CM | POA: Insufficient documentation

## 2019-10-17 LAB — URINALYSIS, COMPLETE (UACMP) WITH MICROSCOPIC
Bacteria, UA: NONE SEEN
Bilirubin Urine: NEGATIVE
Glucose, UA: NEGATIVE mg/dL
Hgb urine dipstick: NEGATIVE
Ketones, ur: NEGATIVE mg/dL
Leukocytes,Ua: NEGATIVE
Nitrite: NEGATIVE
Protein, ur: NEGATIVE mg/dL
Specific Gravity, Urine: 1.012 (ref 1.005–1.030)
pH: 8 (ref 5.0–8.0)

## 2019-10-17 LAB — COMPREHENSIVE METABOLIC PANEL
ALT: 18 U/L (ref 0–44)
AST: 20 U/L (ref 15–41)
Albumin: 4 g/dL (ref 3.5–5.0)
Alkaline Phosphatase: 48 U/L (ref 38–126)
Anion gap: 6 (ref 5–15)
BUN: 9 mg/dL (ref 6–20)
CO2: 23 mmol/L (ref 22–32)
Calcium: 9 mg/dL (ref 8.9–10.3)
Chloride: 107 mmol/L (ref 98–111)
Creatinine, Ser: 0.8 mg/dL (ref 0.44–1.00)
GFR calc Af Amer: >60 mL/min (ref >60)
GFR calc non Af Amer: >60 mL/min (ref >60)
Glucose, Bld: 102 mg/dL — ABNORMAL HIGH (ref 70–99)
Potassium: 3.8 mmol/L (ref 3.5–5.1)
Sodium: 136 mmol/L (ref 135–145)
Total Bilirubin: 0.7 mg/dL (ref 0.3–1.2)
Total Protein: 7.5 g/dL (ref 6.5–8.1)

## 2019-10-17 LAB — RESPIRATORY PANEL BY RT PCR (FLU A&B, COVID)
Influenza A by PCR: NEGATIVE
Influenza B by PCR: NEGATIVE
SARS Coronavirus 2 by RT PCR: NEGATIVE

## 2019-10-17 LAB — CBC
HCT: 44.6 % (ref 36.0–46.0)
Hemoglobin: 14.5 g/dL (ref 12.0–15.0)
MCH: 30.6 pg (ref 26.0–34.0)
MCHC: 32.5 g/dL (ref 30.0–36.0)
MCV: 94.1 fL (ref 80.0–100.0)
Platelets: 269 10*3/uL (ref 150–400)
RBC: 4.74 MIL/uL (ref 3.87–5.11)
RDW: 12.3 % (ref 11.5–15.5)
WBC: 14.9 10*3/uL — ABNORMAL HIGH (ref 4.0–10.5)
nRBC: 0 % (ref 0.0–0.2)

## 2019-10-17 LAB — LIPASE, BLOOD: Lipase: 32 U/L (ref 11–51)

## 2019-10-17 LAB — POCT PREGNANCY, URINE: Preg Test, Ur: NEGATIVE

## 2019-10-17 MED ORDER — ONDANSETRON 4 MG PO TBDP
4.0000 mg | ORAL_TABLET | Freq: Four times a day (QID) | ORAL | Status: DC | PRN
  Administered 2019-10-17: 20:00:00 4 mg via ORAL
  Filled 2019-10-17: qty 1

## 2019-10-17 MED ORDER — IBUPROFEN 400 MG PO TABS: 600.0000 mg | ORAL_TABLET | Freq: Four times a day (QID) | ORAL | Status: DC | PRN

## 2019-10-17 MED ORDER — HYDROMORPHONE HCL 1 MG/ML IJ SOLN: 0.5000 mg | INTRAMUSCULAR | Status: DC | PRN

## 2019-10-17 MED ORDER — IBUPROFEN 600 MG PO TABS
600.0000 mg | ORAL_TABLET | ORAL | Status: AC
  Administered 2019-10-17: 600 mg via ORAL
  Filled 2019-10-17: qty 1

## 2019-10-17 MED ORDER — LACTATED RINGERS IV SOLN: INTRAVENOUS | Status: DC

## 2019-10-17 MED ORDER — ACETAMINOPHEN 650 MG RE SUPP: 650.0000 mg | Freq: Four times a day (QID) | RECTAL | Status: DC | PRN

## 2019-10-17 MED ORDER — ONDANSETRON 4 MG PO TBDP
4.0000 mg | ORAL_TABLET | Freq: Once | ORAL | Status: AC
  Administered 2019-10-17: 4 mg via ORAL
  Filled 2019-10-17: qty 1

## 2019-10-17 MED ORDER — PIPERACILLIN-TAZOBACTAM 3.375 G IVPB
3.3750 g | Freq: Three times a day (TID) | INTRAVENOUS | Status: DC
  Administered 2019-10-17 – 2019-10-18 (×2): 3.375 g via INTRAVENOUS
  Filled 2019-10-17 (×2): qty 50

## 2019-10-17 MED ORDER — IOHEXOL 300 MG/ML  SOLN
125.0000 mL | Freq: Once | INTRAMUSCULAR | Status: AC | PRN
  Administered 2019-10-17: 14:00:00 125 mL via INTRAVENOUS

## 2019-10-17 MED ORDER — MUPIROCIN 2 % EX OINT
1.0000 "application " | TOPICAL_OINTMENT | Freq: Two times a day (BID) | CUTANEOUS | Status: DC
  Filled 2019-10-17: qty 22

## 2019-10-17 MED ORDER — ACETAMINOPHEN 325 MG PO TABS: 650.0000 mg | ORAL_TABLET | Freq: Four times a day (QID) | ORAL | Status: DC | PRN

## 2019-10-17 MED ORDER — ONDANSETRON HCL 4 MG/2ML IJ SOLN
4.0000 mg | Freq: Four times a day (QID) | INTRAMUSCULAR | Status: DC | PRN
  Administered 2019-10-17: 22:00:00 4 mg via INTRAVENOUS
  Filled 2019-10-17: qty 2

## 2019-10-17 NOTE — ED Provider Notes (Signed)
[facility] Emergency Department Provider Note ____________________________________________   First MD Initiated Contact with Patient 10/17/19 1304     (approximate)  I have reviewed the triage vital signs and the nursing notes.  HISTORY  Chief Complaint Abdominal Pain  HPI Robin Ward is a 19 y.o. female here for evaluation of right lower abdominal pain  Patient began experiencing pain in her right lower abdomen yesterday afternoon.  It is a ongoing pain does not seem to get better or worse.  Currently mild but located just in the right lower pelvis.  No vaginal bleeding with it no discharge.  Her last menstrual cycle was 2 to 3 weeks ago normal  Denies pregnancy.  No nausea or vomiting.  No diarrhea.  Pains constant, worse with walking and noticeable with bumps in the car  Denies previous surgeries  No known Covid exposures   History reviewed. No pertinent past medical history.  Patient Active Problem List   Diagnosis Date Noted  . Elevated blood-pressure reading without diagnosis of hypertension 12/02/2016    History reviewed. No pertinent surgical history.  Prior to Admission medications   Medication Sig Start Date End Date Taking? Authorizing Provider  desogestrel-ethinyl estradiol (MIRCETTE) 0.15-0.02/0.01 MG (21/5) tablet Take 1 tablet by mouth at bedtime. 07/08/18 09/01/19  Linzie Collin, MD  omeprazole (PRILOSEC) 40 MG capsule Take 1 capsule (40 mg total) by mouth daily. 07/05/18 09/01/19  Schaevitz, Myra Rude, MD    Allergies Patient has no known allergies.  History reviewed. No pertinent family history.  Social History Social History   Tobacco Use  . Smoking status: Current Every Day Smoker    Packs/day: 1.00    Years: 2.00    Pack years: 2.00    Types: E-cigarettes, Cigarettes  . Smokeless tobacco: Never Used  Substance Use Topics  . Alcohol use: No  . Drug use: No    Review of Systems Constitutional: No  fever/chills Eyes: No visual changes. ENT: No sore throat. Cardiovascular: Denies chest pain. Respiratory: Denies shortness of breath. Gastrointestinal: Pain located in right lower abdomen only Genitourinary: Negative for dysuria. Musculoskeletal: Negative for back pain. Skin: Negative for rash. Neurological: Negative for headaches, areas of focal weakness or numbness.    ____________________________________________   PHYSICAL EXAM:  VITAL SIGNS: ED Triage Vitals  Enc Vitals Group     BP 10/17/19 1133 111/69     Pulse Rate 10/17/19 1133 78     Resp 10/17/19 1133 18     Temp 10/17/19 1133 98 F (36.7 C)     Temp Source 10/17/19 1133 Oral     SpO2 10/17/19 1133 100 %     Weight 10/17/19 1134 (!) 314 lb (142.4 kg)     Height 10/17/19 1134 5\' 7"  (1.702 m)     Head Circumference --      Peak Flow --      Pain Score 10/17/19 1134 8     Pain Loc --      Pain Edu? --      Excl. in GC? --     Constitutional: Alert and oriented. Well appearing and in no acute distress.  She is very pleasant. Eyes: Conjunctivae are normal. Head: Atraumatic. Nose: No congestion/rhinnorhea. Mouth/Throat: Mucous membranes are moist. Neck: No stridor.  Cardiovascular: Normal rate, regular rhythm. Grossly normal heart sounds.  Good peripheral circulation. Respiratory: Normal respiratory effort.  No retractions. Lungs CTAB. Gastrointestinal: Soft and nontender except in the right lower quadrant where she reports  moderate pain located at approximately McBurney's point without rebound tenderness or guarding. No distention.  No CVA tenderness bilateral Musculoskeletal: No lower extremity tenderness nor edema. Neurologic:  Normal speech and language. No gross focal neurologic deficits are appreciated.  Skin:  Skin is warm, dry and intact. No rash noted. Psychiatric: Mood and affect are normal. Speech and behavior are normal.  ____________________________________________   LABS (all labs ordered are  listed, but only abnormal results are displayed)  Labs Reviewed  COMPREHENSIVE METABOLIC PANEL - Abnormal; Notable for the following components:      Result Value   Glucose, Bld 102 (*)    All other components within normal limits  CBC - Abnormal; Notable for the following components:   WBC 14.9 (*)    All other components within normal limits  URINALYSIS, COMPLETE (UACMP) WITH MICROSCOPIC - Abnormal; Notable for the following components:   Color, Urine YELLOW (*)    APPearance CLEAR (*)    All other components within normal limits  RESPIRATORY PANEL BY RT PCR (FLU A&B, COVID)  LIPASE, BLOOD  POC URINE PREG, ED  POCT PREGNANCY, URINE   ____________________________________________  EKG   ____________________________________________  RADIOLOGY  US Pelvis Complete  Result Date: 10/17/2019 CLINICAL DATA:  Right lower quadrant pain EXAM: TRANSABDOMINAL ULTRASOUND OF PELVIS DOPPLER ULTRASOUND OF OVARIES TECHNIQUE: Transabdominal ultrasound examination of the pelvis were performed. Transabdominal technique was performed for global imaging of the pelvis including uterus, ovaries, adnexal regions, and pelvic cul-de-sac. COMPARISON:  None. FINDINGS: Uterus Measurements: 8.8 x 4.1 x 4.4 cm = volume: 82 mL. Arcuate variant anatomy of the uterus. No fibroids or other mass visualized. Endometrium Thickness: 11 mm.  No focal abnormality visualized. Right ovary Measurements: 3.2 x 2.0 x 1.8 cm = volume: 6 mL. Small follicles. Normal appearance/no adnexal mass. Left ovary Measurements: 4.1 x 2.5 x 2.7 cm = volume: 14 mL. Dominant follicle or cyst measuring 2.2 cm. Normal appearance/no adnexal mass. Pulsed Doppler evaluation of both ovaries demonstrates normal low-resistance arterial and venous waveforms. Other findings No abnormal free fluid. IMPRESSION: 1. No ultrasound findings of the pelvis to explain right lower quadrant pain. 2.  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  Electronically Signed   By: Lauralyn Primes M.D.   On: 10/17/2019 15:05   CT ABDOMEN PELVIS W CONTRAST  Result Date: 10/17/2019 CLINICAL DATA:  Right lower quadrant pain EXAM: CT ABDOMEN AND PELVIS WITH CONTRAST TECHNIQUE: Multidetector CT imaging of the abdomen and pelvis was performed using the standard protocol following bolus administration of intravenous contrast. CONTRAST:  OMNIPAQUE IOHEXOL 300 MG/ML  SOLN COMPARISON:  10/22/2017 FINDINGS: Lower chest: No acute abnormality. Hepatobiliary: No solid liver abnormality is seen. No gallstones, gallbladder wall thickening, or biliary dilatation. Pancreas: Unremarkable. No pancreatic ductal dilatation or surrounding inflammatory changes. Spleen: Normal in size without significant abnormality. Adrenals/Urinary Tract: Adrenal glands are unremarkable. Kidneys are normal, without renal calculi, solid lesion, or hydronephrosis. Bladder is unremarkable. Stomach/Bowel: Stomach is within normal limits. The retrocecal appendix is at the upper limits of normal in caliber at 6 mm, increased in size compared to prior examination dated 10/22/2017, with adjacent fat stranding (series 2, image 50). Vascular/Lymphatic: No significant vascular findings are present. No enlarged abdominal or pelvic lymph nodes. Reproductive: No mass or other significant abnormality. Other: No abdominal wall hernia or abnormality. No abdominopelvic ascites. Musculoskeletal: No acute or significant osseous findings. IMPRESSION: The retrocecal appendix is at the upper limits of normal in caliber at 6 mm,  increased in size compared to prior examination dated 10/22/2017, with adjacent fat stranding. Findings are concerning for early appendicitis. No evidence of perforation or abscess. Electronically Signed   By: Lauralyn Primes M.D.   On: 10/17/2019 14:59   [place] Art/Ven Flow Abd Pelv Doppler  Result Date: 10/17/2019 CLINICAL DATA:  Right lower quadrant pain EXAM: TRANSABDOMINAL ULTRASOUND OF PELVIS  DOPPLER ULTRASOUND OF OVARIES TECHNIQUE: Transabdominal ultrasound examination of the pelvis were performed. Transabdominal technique was performed for global imaging of the pelvis including uterus, ovaries, adnexal regions, and pelvic cul-de-sac. COMPARISON:  None. FINDINGS: Uterus Measurements: 8.8 x 4.1 x 4.4 cm = volume: 82 mL. Arcuate variant anatomy of the uterus. No fibroids or other mass visualized. Endometrium Thickness: 11 mm.  No focal abnormality visualized. Right ovary Measurements: 3.2 x 2.0 x 1.8 cm = volume: 6 mL. Small follicles. Normal appearance/no adnexal mass. Left ovary Measurements: 4.1 x 2.5 x 2.7 cm = volume: 14 mL. Dominant follicle or cyst measuring 2.2 cm. Normal appearance/no adnexal mass. Pulsed Doppler evaluation of both ovaries demonstrates normal low-resistance arterial and venous waveforms. Other findings No abnormal free fluid. IMPRESSION: 1. No ultrasound findings of the pelvis to explain right lower quadrant pain. 2.  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 Electronically Signed   By: Lauralyn Primes M.D.   On: 10/17/2019 15:05    No acute ultrasound findings. CT scan reviewed, concerning for possible early appendicitis. ____________________________________________   PROCEDURES  Procedure(s) performed: None  Procedures  Critical Care performed: No  ____________________________________________   INITIAL IMPRESSION / ASSESSMENT AND PLAN / ED COURSE  Pertinent labs & imaging results that were available during my care of the patient were reviewed by me and considered in my medical decision making (see chart for details).   Differential diagnosis includes but is not limited to, abdominal perforation, aortic dissection, cholecystitis, appendicitis, diverticulitis, colitis, esophagitis/gastritis, kidney stone, pyelonephritis, urinary tract infection, aortic aneurysm. All are considered in decision and treatment plan. Based upon the patient's  presentation and risk factors, given the patient's age and negative pregnancy status I suspect she will need imaging including CT scan and ovarian imaging to evaluate for pathology such as appendicitis, ovarian cyst, feel less likely torsion given the nature of her pain and symptoms.  Pain is worsened with movements, focal in the right lower quadrant.  Associated with leukocytosis.  No urinary symptoms.   Clinical Course as of Oct 16 1548  Sat Oct 17, 2019  1548 The retrocecal appendix is at the upper limits of normal in caliber  at 6 mm, increased in size compared to prior examination dated  10/22/2017, with adjacent fat stranding. Findings are concerning for  early appendicitis. No evidence of perforation or abscess.    Patient resting comfortably at this time.  Understanding of plan to await consultation from general surgery.  Possible appendicitisOngoing care assigned to Dr. Scotty Court who will follow up with general surgery consult   [MQ]    Clinical Course User Index [MQ] Sharyn Creamer, MD   Robin Ward was evaluated in Emergency Department on 10/17/2019 for the symptoms described in the history of present illness. She was evaluated in the context of the global COVID-19 pandemic, which necessitated consideration that the patient might be at risk for infection with the SARS-CoV-2 virus that causes COVID-19. Institutional protocols and algorithms that pertain to the evaluation of patients at risk for COVID-19 are in a state of rapid change based on information released by regulatory bodies  including the CDC and federal and state organizations. These policies and algorithms were followed during the patient's care in the ED.   ____________________________________________   FINAL CLINICAL IMPRESSION(S) / ED DIAGNOSES  Final diagnoses:  Right lower quadrant abdominal pain        Note:  This document was prepared using Dragon voice recognition software and may include unintentional  dictation errors       Delman Kitten, MD 10/17/19 1550

## 2019-10-17 NOTE — H&P (Signed)
Robin Ward is an 19 y.o. female.   Chief Complaint: Acute appendicitis HPI: She presented to the emergency department today after onset of abdominal pain last night around 5 or 6 PM.  She said that initially it was diffuse throughout her lower abdomen and radiated a little bit into her back, but over the ensuing hours, it has settled in her right lower quadrant.  She denies any fevers or chills.  No diarrhea or constipation.  She does report that she felt nauseated, but did not vomit.  She denies any vaginal bleeding.  She reports that her last menstrual cycle was normal and was about 2 to 3 weeks ago.  A CT scan was performed in the emergency department that was concerning for early acute appendicitis.  General surgery was consulted by Dr. Delman Kitten for further evaluation and management.  History reviewed. No pertinent past medical history.  History reviewed. No pertinent surgical history.  History reviewed. No pertinent family history. Social History:  reports that she has been smoking e-cigarettes and cigarettes. She has a 2.00 pack-year smoking history. She has never used smokeless tobacco. She reports that she does not drink alcohol or use drugs.  Allergies: No Known Allergies  (Not in a hospital admission)   Results for orders placed or performed during the hospital encounter of 10/17/19 (from the past 48 hour(s))  Lipase, blood     Status: None   Collection Time: 10/17/19 11:36 AM  Result Value Ref Range   Lipase 32 11 - 51 U/L    Comment: Performed at [facility], [address] [place], [place] [address]  Comprehensive metabolic panel     Status: Abnormal   Collection Time: 10/17/19 11:36 AM  Result Value Ref Range   Sodium 136 135 - 145 mmol/L   Potassium 3.8 3.5 - 5.1 mmol/L   Chloride 107 98 - 111 mmol/L   CO2 23 22 - 32 mmol/L   Glucose, Bld 102 (H) 70 - 99 mg/dL    Comment: Glucose reference range applies only to samples taken after fasting for at least  8 hours.   BUN 9 6 - 20 mg/dL   Creatinine, Ser 0.80 0.44 - 1.00 mg/dL   Calcium 9.0 8.9 - 10.3 mg/dL   Total Protein 7.5 6.5 - 8.1 g/dL   Albumin 4.0 3.5 - 5.0 g/dL   AST 20 15 - 41 U/L   ALT 18 0 - 44 U/L   Alkaline Phosphatase 48 38 - 126 U/L   Total Bilirubin 0.7 0.3 - 1.2 mg/dL   GFR calc non Af Amer >60 >60 mL/min   GFR calc Af Amer >60 >60 mL/min   Anion gap 6 5 - 15    Comment: Performed at [facility], [address] [place], [place] [address]  CBC     Status: Abnormal   Collection Time: 10/17/19 11:36 AM  Result Value Ref Range   WBC 14.9 (H) 4.0 - 10.5 K/uL   RBC 4.74 3.87 - 5.11 MIL/uL   Hemoglobin 14.5 12.0 - 15.0 g/dL   HCT 44.6 36.0 - 46.0 %   MCV 94.1 80.0 - 100.0 fL   MCH 30.6 26.0 - 34.0 pg   MCHC 32.5 30.0 - 36.0 g/dL   RDW 12.3 11.5 - 15.5 %   Platelets 269 150 - 400 K/uL   nRBC 0.0 0.0 - 0.2 %    Comment: Performed at [facility], [address] [place], [place] [address]  Urinalysis, Complete w Microscopic     Status: Abnormal   Collection Time: 10/17/19 11:36 AM  Result Value Ref Range   Color, Urine YELLOW (A) YELLOW   APPearance CLEAR (A) CLEAR   Specific Gravity, Urine 1.012 1.005 - 1.030   pH 8.0 5.0 - 8.0   Glucose, UA NEGATIVE NEGATIVE mg/dL   Hgb urine dipstick NEGATIVE NEGATIVE   Bilirubin Urine NEGATIVE NEGATIVE   Ketones, ur NEGATIVE NEGATIVE mg/dL   Protein, ur NEGATIVE NEGATIVE mg/dL   Nitrite NEGATIVE NEGATIVE   Leukocytes,Ua NEGATIVE NEGATIVE   RBC / HPF 0-5 0 - 5 RBC/hpf   WBC, UA 0-5 0 - 5 WBC/hpf   Bacteria, UA NONE SEEN NONE SEEN   Squamous Epithelial / LPF 0-5 0 - 5   Mucus PRESENT     Comment: Performed at [facility], [address] [place], [place] [address]  Pregnancy, urine POC     Status: None   Collection Time: 10/17/19 11:46 AM  Result Value Ref Range   Preg Test, Ur NEGATIVE NEGATIVE    Comment:        THE SENSITIVITY OF THIS METHODOLOGY IS >24 mIU/mL    US Pelvis  Complete  Result Date: 10/17/2019 CLINICAL DATA:  Right lower quadrant pain EXAM: TRANSABDOMINAL ULTRASOUND OF PELVIS DOPPLER ULTRASOUND OF OVARIES TECHNIQUE: Transabdominal ultrasound examination of the pelvis were performed. Transabdominal technique was performed for global imaging of the pelvis including uterus, ovaries, adnexal regions, and pelvic cul-de-sac. COMPARISON:  None. FINDINGS: Uterus Measurements: 8.8 x 4.1 x 4.4 cm = volume: 82 mL. Arcuate variant anatomy of the uterus. No fibroids or other mass visualized. Endometrium Thickness: 11 mm.  No focal abnormality visualized. Right ovary Measurements: 3.2 x 2.0 x 1.8 cm = volume: 6 mL. Small follicles. Normal appearance/no adnexal mass. Left ovary Measurements: 4.1 x 2.5 x 2.7 cm = volume: 14 mL. Dominant follicle or cyst measuring 2.2 cm. Normal appearance/no adnexal mass. Pulsed Doppler evaluation of both ovaries demonstrates normal low-resistance arterial and venous waveforms. Other findings No abnormal free fluid. IMPRESSION: 1. No ultrasound findings of the pelvis to explain right lower quadrant pain. 2.  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 Electronically Signed   By: Lauralyn Primes M.D.   On: 10/17/2019 15:05   CT ABDOMEN PELVIS W CONTRAST  Result Date: 10/17/2019 CLINICAL DATA:  Right lower quadrant pain EXAM: CT ABDOMEN AND PELVIS WITH CONTRAST TECHNIQUE: Multidetector CT imaging of the abdomen and pelvis was performed using the standard protocol following bolus administration of intravenous contrast. CONTRAST:  OMNIPAQUE IOHEXOL 300 MG/ML  SOLN COMPARISON:  10/22/2017 FINDINGS: Lower chest: No acute abnormality. Hepatobiliary: No solid liver abnormality is seen. No gallstones, gallbladder wall thickening, or biliary dilatation. Pancreas: Unremarkable. No pancreatic ductal dilatation or surrounding inflammatory changes. Spleen: Normal in size without significant abnormality. Adrenals/Urinary Tract: Adrenal glands are  unremarkable. Kidneys are normal, without renal calculi, solid lesion, or hydronephrosis. Bladder is unremarkable. Stomach/Bowel: Stomach is within normal limits. The retrocecal appendix is at the upper limits of normal in caliber at 6 mm, increased in size compared to prior examination dated 10/22/2017, with adjacent fat stranding (series 2, image 50). Vascular/Lymphatic: No significant vascular findings are present. No enlarged abdominal or pelvic lymph nodes. Reproductive: No mass or other significant abnormality. Other: No abdominal wall hernia or abnormality. No abdominopelvic ascites. Musculoskeletal: No acute or significant osseous findings. IMPRESSION: The retrocecal appendix is at the upper limits of normal in caliber at 6  mm, increased in size compared to prior examination dated 10/22/2017, with adjacent fat stranding. Findings are concerning for early appendicitis. No evidence of perforation or abscess. Electronically Signed   By: Lauralyn Primes M.D.   On: 10/17/2019 14:59   [place] Art/Ven Flow Abd Pelv Doppler  Result Date: 10/17/2019 CLINICAL DATA:  Right lower quadrant pain EXAM: TRANSABDOMINAL ULTRASOUND OF PELVIS DOPPLER ULTRASOUND OF OVARIES TECHNIQUE: Transabdominal ultrasound examination of the pelvis were performed. Transabdominal technique was performed for global imaging of the pelvis including uterus, ovaries, adnexal regions, and pelvic cul-de-sac. COMPARISON:  None. FINDINGS: Uterus Measurements: 8.8 x 4.1 x 4.4 cm = volume: 82 mL. Arcuate variant anatomy of the uterus. No fibroids or other mass visualized. Endometrium Thickness: 11 mm.  No focal abnormality visualized. Right ovary Measurements: 3.2 x 2.0 x 1.8 cm = volume: 6 mL. Small follicles. Normal appearance/no adnexal mass. Left ovary Measurements: 4.1 x 2.5 x 2.7 cm = volume: 14 mL. Dominant follicle or cyst measuring 2.2 cm. Normal appearance/no adnexal mass. Pulsed Doppler evaluation of both ovaries demonstrates normal low-resistance  arterial and venous waveforms. Other findings No abnormal free fluid. IMPRESSION: 1. No ultrasound findings of the pelvis to explain right lower quadrant pain. 2.  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 Electronically Signed   By: Lauralyn Primes M.D.   On: 10/17/2019 15:05    Review of Systems  All other systems reviewed and are negative. Or as discussed in the history of present illness.  Blood pressure (!) 114/56, pulse (!) 59, temperature 98 F (36.7 C), temperature source Oral, resp. rate 18, height 5\' 7"  (1.702 m), weight (!) 142.4 kg, last menstrual period 10/03/2019, SpO2 99 %. Body mass index is 49.18 kg/m.  Physical Exam  Constitutional: She is oriented to person, place, and time. She appears well-developed and well-nourished.  Morbidly obese.  HENT:  Head: Normocephalic and atraumatic.  Mouth and nose are covered with a mask, secondary to COVID-19 precautions  Eyes: Right eye exhibits no discharge. Left eye exhibits no discharge. No scleral icterus.  Neck: No tracheal deviation present.  Cardiovascular: Normal rate and regular rhythm.  Respiratory: Effort normal. No stridor. No respiratory distress.  GI: Soft. She exhibits no mass. There is abdominal tenderness. There is no rebound and no guarding.  She is tender to palpation in the right lower quadrant.  No peritoneal signs appreciated on exam.  Exam is somewhat limited secondary to patient body habitus.  Genitourinary:    Genitourinary Comments: Deferred   Musculoskeletal:        General: No deformity.  Neurological: She is alert and oriented to person, place, and time.  Skin: Skin is warm and dry.  Psychiatric: She has a normal mood and affect. Her behavior is normal. Thought content normal.     Assessment/Plan This is a 19 year old woman who presented to the emergency department with right lower quadrant pain.  Gynecological sources have been ruled out.  Elevated white blood cell count and CT scan  findings are most suggestive of early acute appendicitis.  I offered her the option of observation/expectant management with antibiotic treatment versus surgery.  After discussion with her family, she expressed a desire to proceed with operative intervention.  The risks of the procedure were discussed with her.  These include, but are not limited to, bleeding, infection, damage to surrounding structures, need to convert to open operation, need for additional interventions.  She agreed to accept these risks.  We will proceed to the  operating room pending OR and anesthesia availability, likely first thing tomorrow morning.  In the meantime, she will be given IV hydration, pain control, and IV antibiotics.  Duanne Guess, MD 10/17/2019, 4:22 PM

## 2019-10-17 NOTE — ED Notes (Signed)
Nurse Secretary has called for transport. 

## 2019-10-17 NOTE — ED Triage Notes (Signed)
RLQ pain since yesterday. Also c/o back pain. Some nausea, no vomiting,diarrhea, fever. A&O, ambulatory. Denies urinary symptoms.

## 2019-10-17 NOTE — ED Notes (Signed)
Surgeon at bedside at this time. 

## 2019-10-17 NOTE — Discharge Instructions (Signed)
Abdominal Pain During Pregnancy  Belly (abdominal) pain is common during pregnancy. There are many possible causes. Most of the time, it is not a serious problem. Other times, it can be a sign that something is wrong with the pregnancy. Always tell your doctor if you have belly pain. Follow these instructions at home:  Do not have sex or put anything in your vagina until your pain goes away completely.  Get plenty of rest until your pain gets better.  Drink enough fluid to keep your pee (urine) pale yellow.  Take over-the-counter and prescription medicines only as told by your doctor.  Keep all follow-up visits as told by your doctor. This is important. Contact a doctor if:  Your pain continues or gets worse after resting.  You have lower belly pain that: ? Comes and goes at regular times. ? Spreads to your back. ? Feels like menstrual cramps.  You have pain or burning when you pee (urinate). Get help right away if:  You have a fever or chills.  You have vaginal bleeding.  You are leaking fluid from your vagina.  You are passing tissue from your vagina.  You throw up (vomit) for more than 24 hours.  You have watery poop (diarrhea) for more than 24 hours.  Your baby is moving less than usual.  You feel very weak or faint.  You have shortness of breath.  You have very bad pain in your upper belly. Summary  Belly (abdominal) pain is common during pregnancy. There are many possible causes.  If you have belly pain during pregnancy, tell your doctor right away.  Keep all follow-up visits as told by your doctor. This is important. This information is not intended to replace advice given to you by your health care provider. Make sure you discuss any questions you have with your health care provider. Document Revised: 09/22/2018 Document Reviewed: 09/06/2016 Elsevier Patient Education  2020 [employer] Inc[employer]  

## 2019-10-18 ENCOUNTER — Encounter
Admission: EM | Disposition: A | Discharge status: Home / Self Care | Payer: Self-pay | Source: Home / Self Care | Attending: Emergency Medicine

## 2019-10-18 ENCOUNTER — Observation Stay: Payer: Self-pay | Admitting: Certified Registered"

## 2019-10-18 ENCOUNTER — Encounter: Payer: Self-pay | Admitting: General Surgery

## 2019-10-18 HISTORY — PX: LAPAROSCOPIC APPENDECTOMY: SHX408

## 2019-10-18 LAB — BASIC METABOLIC PANEL
Anion gap: 6 (ref 5–15)
BUN: 9 mg/dL (ref 6–20)
CO2: 24 mmol/L (ref 22–32)
Calcium: 8.9 mg/dL (ref 8.9–10.3)
Chloride: 110 mmol/L (ref 98–111)
Creatinine, Ser: 0.92 mg/dL (ref 0.44–1.00)
GFR calc Af Amer: >60 mL/min (ref >60)
GFR calc non Af Amer: >60 mL/min (ref >60)
Glucose, Bld: 94 mg/dL (ref 70–99)
Potassium: 3.7 mmol/L (ref 3.5–5.1)
Sodium: 140 mmol/L (ref 135–145)

## 2019-10-18 LAB — CBC
HCT: 42.2 % (ref 36.0–46.0)
Hemoglobin: 14.3 g/dL (ref 12.0–15.0)
MCH: 31.2 pg (ref 26.0–34.0)
MCHC: 33.9 g/dL (ref 30.0–36.0)
MCV: 91.9 fL (ref 80.0–100.0)
Platelets: 263 10*3/uL (ref 150–400)
RBC: 4.59 MIL/uL (ref 3.87–5.11)
RDW: 12.4 % (ref 11.5–15.5)
WBC: 10.3 10*3/uL (ref 4.0–10.5)
nRBC: 0 % (ref 0.0–0.2)

## 2019-10-18 LAB — MAGNESIUM: Magnesium: 2.2 mg/dL (ref 1.7–2.4)

## 2019-10-18 LAB — HIV ANTIBODY (ROUTINE TESTING W REFLEX): HIV Screen 4th Generation wRfx: NONREACTIVE

## 2019-10-18 SURGERY — APPENDECTOMY, LAPAROSCOPIC
Anesthesia: General | Site: Abdomen

## 2019-10-18 MED ORDER — SUCCINYLCHOLINE CHLORIDE 20 MG/ML IJ SOLN
INTRAMUSCULAR | Status: DC | PRN
  Administered 2019-10-18: 120 mg via INTRAVENOUS

## 2019-10-18 MED ORDER — MIDAZOLAM HCL 2 MG/2ML IJ SOLN
INTRAMUSCULAR | Status: AC
  Filled 2019-10-18: qty 2

## 2019-10-18 MED ORDER — MUPIROCIN 2 % EX OINT: 1.0000 "application " | TOPICAL_OINTMENT | Freq: Two times a day (BID) | CUTANEOUS | 0 refills | Status: DC

## 2019-10-18 MED ORDER — LIDOCAINE-EPINEPHRINE 1 %-1:100000 IJ SOLN
INTRAMUSCULAR | Status: DC | PRN
  Administered 2019-10-18: 50 mL

## 2019-10-18 MED ORDER — FENTANYL CITRATE (PF) 100 MCG/2ML IJ SOLN
25.0000 ug | INTRAMUSCULAR | Status: DC | PRN
  Administered 2019-10-18 (×4): 25 ug via INTRAVENOUS

## 2019-10-18 MED ORDER — SUGAMMADEX SODIUM 200 MG/2ML IV SOLN
INTRAVENOUS | Status: DC | PRN
  Administered 2019-10-18: 300 mg via INTRAVENOUS

## 2019-10-18 MED ORDER — DEXAMETHASONE SODIUM PHOSPHATE 10 MG/ML IJ SOLN
INTRAMUSCULAR | Status: DC | PRN
  Administered 2019-10-18: 5 mg via INTRAVENOUS

## 2019-10-18 MED ORDER — PROPOFOL 10 MG/ML IV BOLUS
INTRAVENOUS | Status: DC | PRN
  Administered 2019-10-18: 200 mg via INTRAVENOUS

## 2019-10-18 MED ORDER — OXYCODONE-ACETAMINOPHEN 5-325 MG PO TABS: 1.0000 | ORAL_TABLET | ORAL | 0 refills | Status: DC | PRN

## 2019-10-18 MED ORDER — MIDAZOLAM HCL 2 MG/2ML IJ SOLN
INTRAMUSCULAR | Status: DC | PRN
  Administered 2019-10-18: 2 mg via INTRAVENOUS

## 2019-10-18 MED ORDER — FENTANYL CITRATE (PF) 100 MCG/2ML IJ SOLN
INTRAMUSCULAR | Status: AC
  Filled 2019-10-18: qty 2

## 2019-10-18 MED ORDER — ONDANSETRON HCL 4 MG/2ML IJ SOLN: 4.0000 mg | Freq: Once | INTRAMUSCULAR | Status: DC | PRN

## 2019-10-18 MED ORDER — IBUPROFEN 600 MG PO TABS: 600.0000 mg | ORAL_TABLET | Freq: Four times a day (QID) | ORAL | 0 refills | Status: DC | PRN

## 2019-10-18 MED ORDER — DOCUSATE SODIUM 100 MG PO CAPS: 100.0000 mg | ORAL_CAPSULE | Freq: Every day | ORAL | Status: DC | PRN

## 2019-10-18 MED ORDER — ONDANSETRON HCL 4 MG/2ML IJ SOLN
INTRAMUSCULAR | Status: DC | PRN
  Administered 2019-10-18: 4 mg via INTRAVENOUS

## 2019-10-18 MED ORDER — VISTASEAL 10 ML SINGLE DOSE KIT
PACK | CUTANEOUS | Status: AC
  Filled 2019-10-18: qty 10

## 2019-10-18 MED ORDER — FENTANYL CITRATE (PF) 100 MCG/2ML IJ SOLN
INTRAMUSCULAR | Status: DC | PRN
  Administered 2019-10-18 (×2): 50 ug via INTRAVENOUS
  Administered 2019-10-18: 100 ug via INTRAVENOUS

## 2019-10-18 MED ORDER — OXYCODONE-ACETAMINOPHEN 5-325 MG PO TABS
1.0000 | ORAL_TABLET | ORAL | Status: DC | PRN
  Administered 2019-10-18: 11:00:00 1 via ORAL
  Filled 2019-10-18: qty 1

## 2019-10-18 MED ORDER — ROCURONIUM BROMIDE 100 MG/10ML IV SOLN
INTRAVENOUS | Status: DC | PRN
  Administered 2019-10-18: 40 mg via INTRAVENOUS
  Administered 2019-10-18: 10 mg via INTRAVENOUS

## 2019-10-18 MED ORDER — ACETAMINOPHEN 10 MG/ML IV SOLN
INTRAVENOUS | Status: AC
  Filled 2019-10-18: qty 100

## 2019-10-18 MED ORDER — ACETAMINOPHEN 10 MG/ML IV SOLN
INTRAVENOUS | Status: DC | PRN
  Administered 2019-10-18: 1000 mg via INTRAVENOUS

## 2019-10-18 MED ORDER — DOCUSATE SODIUM 100 MG PO CAPS: 100.0000 mg | ORAL_CAPSULE | Freq: Every day | ORAL | 0 refills | Status: DC | PRN

## 2019-10-18 MED ORDER — LIDOCAINE HCL (CARDIAC) PF 100 MG/5ML IV SOSY
PREFILLED_SYRINGE | INTRAVENOUS | Status: DC | PRN
  Administered 2019-10-18: 100 mg via INTRAVENOUS

## 2019-10-18 MED ORDER — ONDANSETRON 4 MG PO TBDP: 4.0000 mg | ORAL_TABLET | Freq: Four times a day (QID) | ORAL | 0 refills | Status: DC | PRN

## 2019-10-18 MED ORDER — PROPOFOL 10 MG/ML IV BOLUS
INTRAVENOUS | Status: AC
  Filled 2019-10-18: qty 40

## 2019-10-18 MED ORDER — ACETAMINOPHEN 325 MG PO TABS: 650.0000 mg | ORAL_TABLET | Freq: Four times a day (QID) | ORAL | 0 refills | Status: AC | PRN

## 2019-10-18 SURGICAL SUPPLY — 54 items
APPLICATOR ARISTA FLEXITIP XL (MISCELLANEOUS) ×2 IMPLANT
APPLICATOR COTTON TIP 6 STRL (MISCELLANEOUS) ×1 IMPLANT
APPLICATOR COTTON TIP 6IN STRL (MISCELLANEOUS) ×2
APPLIER CLIP 5 13 M/L LIGAMAX5 (MISCELLANEOUS)
BLADE CLIPPER SURG (BLADE) IMPLANT
BLADE SURG SZ11 CARB STEEL (BLADE) ×2 IMPLANT
CANISTER SUCT 1200ML W/VALVE (MISCELLANEOUS) ×2 IMPLANT
CHLORAPREP W/TINT 26 (MISCELLANEOUS) ×2 IMPLANT
CLIP APPLIE 5 13 M/L LIGAMAX5 (MISCELLANEOUS) IMPLANT
COVER WAND RF STERILE (DRAPES) ×2 IMPLANT
CUTTER FLEX LINEAR 45M (STAPLE) ×2 IMPLANT
DEFOGGER SCOPE WARMER CLEARIFY (MISCELLANEOUS) ×2 IMPLANT
DERMABOND ADVANCED (GAUZE/BANDAGES/DRESSINGS) ×1
DERMABOND ADVANCED .7 DNX12 (GAUZE/BANDAGES/DRESSINGS) ×1 IMPLANT
ELECT CAUTERY BLADE TIP 2.5 (TIP) ×2
ELECT REM PT RETURN 9FT ADLT (ELECTROSURGICAL) ×2
ELECTRODE CAUTERY BLDE TIP 2.5 (TIP) ×1 IMPLANT
ELECTRODE REM PT RTRN 9FT ADLT (ELECTROSURGICAL) ×1 IMPLANT
GLOVE BIO SURGEON STRL SZ 6.5 (GLOVE) ×2 IMPLANT
GLOVE INDICATOR 7.0 STRL GRN (GLOVE) ×2 IMPLANT
GOWN STRL REUS W/ TWL LRG LVL3 (GOWN DISPOSABLE) ×2 IMPLANT
GOWN STRL REUS W/TWL LRG LVL3 (GOWN DISPOSABLE) ×2
GRASPER SUT TROCAR 14GX15 (MISCELLANEOUS) ×2 IMPLANT
HEMOSTAT ARISTA ABSORB 3G PWDR (HEMOSTASIS) ×2 IMPLANT
IRRIGATION STRYKERFLOW (MISCELLANEOUS) ×1 IMPLANT
IRRIGATOR STRYKERFLOW (MISCELLANEOUS) ×2
IV NS 1000ML (IV SOLUTION) ×1
IV NS 1000ML BAXH (IV SOLUTION) ×1 IMPLANT
KIT TURNOVER KIT A (KITS) ×2 IMPLANT
KITTNER LAPARASCOPIC 5X40 (MISCELLANEOUS) ×2 IMPLANT
LABEL OR SOLS (LABEL) ×2 IMPLANT
LIGASURE LAP MARYLAND 5MM 37CM (ELECTROSURGICAL) IMPLANT
NEEDLE HYPO 22GX1.5 SAFETY (NEEDLE) ×2 IMPLANT
NS IRRIG 500ML POUR BTL (IV SOLUTION) ×2 IMPLANT
PACK LAP CHOLECYSTECTOMY (MISCELLANEOUS) ×2 IMPLANT
PENCIL ELECTRO HAND CTR (MISCELLANEOUS) ×2 IMPLANT
POUCH SPECIMEN RETRIEVAL 10MM (ENDOMECHANICALS) ×2 IMPLANT
RELOAD STAPLE TA45 3.5 REG BLU (ENDOMECHANICALS) ×2 IMPLANT
SCISSORS METZENBAUM CVD 33 (INSTRUMENTS) ×2 IMPLANT
SET TUBE SMOKE EVAC HIGH FLOW (TUBING) ×2 IMPLANT
SHEARS HARMONIC ACE PLUS 36CM (ENDOMECHANICALS) ×2 IMPLANT
SLEEVE ADV FIXATION 5X100MM (TROCAR) ×2 IMPLANT
STRIP CLOSURE SKIN 1/2X4 (GAUZE/BANDAGES/DRESSINGS) ×2 IMPLANT
SUT MNCRL 4-0 (SUTURE) ×1
SUT MNCRL 4-0 27XMFL (SUTURE) ×1
SUT VIC AB 3-0 SH 27 (SUTURE) ×1
SUT VIC AB 3-0 SH 27X BRD (SUTURE) ×1 IMPLANT
SUT VICRYL 0 AB UR-6 (SUTURE) ×2 IMPLANT
SUTURE MNCRL 4-0 27XMF (SUTURE) ×1 IMPLANT
SYS KII FIOS ACCESS ABD 5X100 (TROCAR) ×2
SYSTEM KII FIOS ACES ABD 5X100 (TROCAR) ×1 IMPLANT
TRAY FOLEY MTR SLVR 16FR STAT (SET/KITS/TRAYS/PACK) ×2 IMPLANT
TROCAR 130MM GELPORT  DAV (MISCELLANEOUS) ×2 IMPLANT
TROCAR ADV FIXATION 12X100MM (TROCAR) IMPLANT

## 2019-10-18 NOTE — Discharge Summary (Signed)
Physician Discharge Summary  Patient ID: Robin Ward MRN: [id number] DOB/AGE: August 03, 2000 19 y.o.  Admit date: 10/17/2019 Discharge date: 10/18/2019  Admission Diagnoses: Acute appendicitis  Discharge Diagnoses:  Active Problems:   Acute appendicitis Status post laparoscopic appendectomy  Discharged Condition: good  Hospital Course: This is a 19 year old woman who presented to the emergency department yesterday with approximately 18 hours history of right lower quadrant pain.  Evaluation was consistent with early acute appendicitis.  She was admitted, placed on IV antibiotics, and subsequently taken to the operating room where she underwent an uncomplicated laparoscopic appendectomy.  Following the procedure, she made appropriate post operative progress.  By the time of discharge, she was tolerating a regular diet and her pain was controlled on oral pain medications.  She was felt to be appropriate for discharge to home  Consults: None  Significant Diagnostic Studies: CT scan demonstrating acute appendicitis and CBC with elevated WBC.  Treatments: antibiotics: Zosyn and surgery: laparoscopic appendectomy  Discharge Exam: Blood pressure (!) 117/55, pulse 69, temperature (!) 97.5 F (36.4 C), temperature source Oral, resp. rate 15, height 5\' 7"  (1.702 m), weight (!) 142.4 kg, last menstrual period 10/03/2019, SpO2 100 %.   General appearance: alert, no distress and morbidly obese Resp: Normal WOB on RA Cardio: regular rate and rhythm Incision/Wound: incisions dressed with Steri-Strips  Disposition:   Discharge Instructions    Call MD for:  difficulty breathing, headache or visual disturbances   Complete by: As directed    Call MD for:  extreme fatigue   Complete by: As directed    Call MD for:  hives   Complete by: As directed    Call MD for:  persistant dizziness or light-headedness   Complete by: As directed    Call MD for:  persistant nausea and vomiting   Complete by:  As directed    Call MD for:  redness, tenderness, or signs of infection (pain, swelling, redness, odor or green/yellow discharge around incision site)   Complete by: As directed    Call MD for:  severe uncontrolled pain   Complete by: As directed    Call MD for:  temperature >100.4   Complete by: As directed    Diet - low sodium heart healthy   Complete by: As directed    Driving Restrictions   Complete by: As directed    No driving for 1 week or while taking narcotic pain medications.   Increase activity slowly   Complete by: As directed    Leave dressing on - Keep it clean, dry, and intact until clinic visit   Complete by: As directed    You may shower starting on Tuesday.  Do not submerge the incisions or allow them to become saturated with water or sweat.  It is okay if they get a little bit damp; just pat them dry gently with a clean soft towel.  Leave the Steri-Strips (paper tapes) in place and allow them to fall off on their own.   Lifting restrictions   Complete by: As directed    No lifting anything heavier than 10 pounds for 3 weeks.     Allergies as of 10/18/2019   No Known Allergies     Medication List    TAKE these medications   acetaminophen 325 MG tablet Commonly known as: TYLENOL Take 2 tablets (650 mg total) by mouth every 6 (six) hours as needed for mild pain or headache (or temp > 100).   docusate sodium 100  MG capsule Commonly known as: COLACE Take 1 capsule (100 mg total) by mouth daily as needed for mild constipation.   ibuprofen 600 MG tablet Commonly known as: ADVIL Take 1 tablet (600 mg total) by mouth every 6 (six) hours as needed for mild pain.   mupirocin ointment 2 % Commonly known as: BACTROBAN Place 1 application into the nose 2 (two) times daily.   ondansetron 4 MG disintegrating tablet Commonly known as: ZOFRAN-ODT Take 1 tablet (4 mg total) by mouth every 6 (six) hours as needed for nausea.   oxyCODONE-acetaminophen 5-325 MG  tablet Commonly known as: PERCOCET/ROXICET Take 1 tablet by mouth every 4 (four) hours as needed for moderate pain.        Signed: Duanne Guess 10/18/2019, 1:06 PM

## 2019-10-18 NOTE — Transfer of Care (Signed)
Immediate Anesthesia Transfer of Care Note  Patient: Robin Ward  Procedure(s) Performed: APPENDECTOMY LAPAROSCOPIC (N/A Abdomen)  Patient Location: PACU  Anesthesia Type:General  Level of Consciousness: awake, alert  and oriented  Airway & Oxygen Therapy: Patient Spontanous Breathing and Patient connected to face mask oxygen  Post-op Assessment: Report given to RN and Post -op Vital signs reviewed and stable  Post vital signs: Reviewed and stable  Last Vitals:  Vitals Value Taken Time  BP    Temp    Pulse    Resp    SpO2      Last Pain:  Vitals:   10/18/19 0508  TempSrc: Oral  PainSc:          Complications: No apparent anesthesia complications

## 2019-10-18 NOTE — Op Note (Signed)
Operative Note  Laparoscopic Appendectomy   Robin Ward Date of operation:  10/18/2019  Indications: The patient presented with a history of  abdominal pain. Workup has revealed findings consistent with acute appendicitis.  Pre-operative Diagnosis: Acute appendicitis without mention of peritonitis  Post-operative Diagnosis: Same  Surgeon: Duanne Guess, MD  Anesthesia: GETA  Findings: mild inflammation of a retrocecal appendix without evidence of perforation or abscess  Estimated Blood Loss: 5 cc         Specimens: appendix         Complications:  None immediately apparent.  Procedure Details  The patient was seen again in the preop area. The options of surgery versus observation were reviewed with the patient and/or family. The risks of bleeding, infection, recurrence of symptoms, negative laparoscopy, potential for an open procedure, bowel injury, abscess or infection, were all reviewed as well. The patient was taken to Operating Room, identified as Robin Ward and the procedure verified as laparoscopic appendectomy. A time out was performed and the above information confirmed.  The patient was placed in the supine position and general anesthesia was induced.  Antibiotic prophylaxis was administered and VTE prophylaxis was in place. A Foley catheter was placed by the nursing staff.   The abdomen was prepped and draped in a sterile fashion.  Due to the patient's morbid obesity, I entered the abdomen in the right upper quadrant via the Optiview technique.  Pneumoperitoneum was achieved.  An infraumbilical incision was made and a 10 mm trocar introduced. Two 5 mm working ports were placed under direct visualization.  The appendix was identified and found to be acutely inflamed and retrocecal.  There was no evidence of perforation and no pus or abscess identified. The appendix was carefully dissected. The mesoappendix was divided with the Harmonic scalpel. The base of the  appendix was dissected out and divided with a standard load Endo GIA.The appendix was placed in a Endo Catch bag and removed via the 10 mm port. The right lower quadrant and pelvis was then irrigated with normal saline which was then aspirated. The right lower quadrant was inspected.  There was some oozing from the staple line which was treated with a coating of Arista powder.  There was no further sign of bleeding or bowel injury therefore pneumoperitoneum was released, all ports were removed.  The umbilical fascia was closed with 0 Vicryl interrupted sutures and the skin incisions were approximated with subcuticular 4-0 Monocryl. Dermabond was applied, followed by Steri-Strips.  The Foley catheter was removed.  The patient was then awakened, extubated, and taken to the postanesthesia care unit in good condition.  The patient tolerated the procedure well and there were no immediately apparent complications. The sponge lap and needle count were correct at the end of the procedure.    Duanne Guess, MD, FACS

## 2019-10-18 NOTE — Anesthesia Procedure Notes (Signed)
Procedure Name: Intubation Date/Time: 10/18/2019 8:19 AM Performed by: Clyde Lundborg, CRNA Pre-anesthesia Checklist: Patient identified, Emergency Drugs available, Suction available and Patient being monitored Patient Re-evaluated:Patient Re-evaluated prior to induction Oxygen Delivery Method: Circle system utilized Preoxygenation: Pre-oxygenation with 100% oxygen Induction Type: IV induction Ventilation: Mask ventilation without difficulty Laryngoscope Size: McGraph and 3 Grade View: Grade I Tube type: Oral Number of attempts: 1 Airway Equipment and Method: Stylet and Video-laryngoscopy Placement Confirmation: ETT inserted through vocal cords under direct vision,  positive ETCO2,  breath sounds checked- equal and bilateral and CO2 detector Secured at: 21 cm Tube secured with: Tape Dental Injury: Teeth and Oropharynx as per pre-operative assessment

## 2019-10-18 NOTE — Anesthesia Preprocedure Evaluation (Signed)
Anesthesia Evaluation  Patient identified by MRN, date of birth, ID band Patient awake    Reviewed: Allergy & Precautions, NPO status , Patient's Chart, lab work & pertinent test results  Airway Mallampati: II       Dental  (+) Teeth Intact   Pulmonary Current Smoker,    Pulmonary exam normal        Cardiovascular negative cardio ROS Normal cardiovascular exam     Neuro/Psych negative neurological ROS  negative psych ROS   GI/Hepatic Neg liver ROS,   Endo/Other  Morbid obesity  Renal/GU negative Renal ROS  negative genitourinary   Musculoskeletal negative musculoskeletal ROS (+)   Abdominal (+) + obese,   Peds negative pediatric ROS (+)  Hematology negative hematology ROS (+)   Anesthesia Other Findings   Reproductive/Obstetrics                             Anesthesia Physical Anesthesia Plan  ASA: III and emergent  Anesthesia Plan: General   Post-op Pain Management:    Induction: Intravenous, Cricoid pressure planned and Rapid sequence  PONV Risk Score and Plan:   Airway Management Planned: Oral ETT  Additional Equipment:   Intra-op Plan:   Post-operative Plan: Extubation in OR  Informed Consent: I have reviewed the patients History and Physical, chart, labs and discussed the procedure including the risks, benefits and alternatives for the proposed anesthesia with the patient or authorized representative who has indicated his/her understanding and acceptance.     Dental advisory given  Plan Discussed with: CRNA and Surgeon  Anesthesia Plan Comments:         Anesthesia Quick Evaluation

## 2019-10-18 NOTE — Progress Notes (Signed)
Pt discharged per MD order. IV removed. Discharge instructions reviewed with pt. Pt verbalized understanding. Pt taken to car in wheelchair by staff.  

## 2019-10-18 NOTE — Anesthesia Postprocedure Evaluation (Signed)
Anesthesia Post Note  Patient: Robin Ward  Procedure(s) Performed: APPENDECTOMY LAPAROSCOPIC (N/A Abdomen)  Patient location during evaluation: PACU Anesthesia Type: General Level of consciousness: awake and alert and oriented Pain management: pain level controlled Vital Signs Assessment: post-procedure vital signs reviewed and stable Respiratory status: spontaneous breathing Cardiovascular status: blood pressure returned to baseline Anesthetic complications: no     Last Vitals:  Vitals:   10/18/19 1123 10/18/19 1230  BP: (!) 114/51 (!) 117/55  Pulse: 90 69  Resp: 18 15  Temp: 37 C (!) 36.4 C  SpO2: 99% 100%    Last Pain:  Vitals:   10/18/19 1230  TempSrc: Oral  PainSc:                  Brandom Kerwin

## 2019-10-20 LAB — SURGICAL PATHOLOGY

## 2021-01-01 ENCOUNTER — Encounter: Payer: Self-pay | Admitting: Emergency Medicine

## 2021-01-01 ENCOUNTER — Ambulatory Visit
Admission: EM | Admit: 2021-01-01 | Discharge: 2021-01-01 | Disposition: A | Discharge status: Home / Self Care | Payer: Self-pay | Attending: Family Medicine | Admitting: Family Medicine

## 2021-01-01 ENCOUNTER — Other Ambulatory Visit: Payer: Self-pay

## 2021-01-01 DIAGNOSIS — S61011A Laceration without foreign body of right thumb without damage to nail, initial encounter: Secondary | ICD-10-CM

## 2021-01-01 DIAGNOSIS — Z23 Encounter for immunization: Secondary | ICD-10-CM

## 2021-01-01 MED ORDER — TETANUS-DIPHTH-ACELL PERTUSSIS 5-2.5-18.5 LF-MCG/0.5 IM SUSY
0.5000 mL | PREFILLED_SYRINGE | Freq: Once | INTRAMUSCULAR | Status: AC
  Administered 2021-01-01: 0.5 mL via INTRAMUSCULAR

## 2021-01-01 NOTE — ED Provider Notes (Signed)
[facility]    CSN: [id number] Arrival date & time: 01/01/21  1557      History   Chief Complaint Chief Complaint  Patient presents with   Laceration    Right thumb    HPI  20 year old female presents with a laceration to the right thumb.  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  This occurred around 330.  She states that she is not claiming this is a Designer, multimedia. injury.  Bleeding is well controlled.  In need of tetanus today.  Pain is 6/10 in severity.  No other complaints or concerns at this time.  Patient Active Problem List   Diagnosis Date Noted   Acute appendicitis 10/17/2019   Elevated blood-pressure reading without diagnosis of hypertension 12/02/2016    Past Surgical History:  Procedure Laterality Date   LAPAROSCOPIC APPENDECTOMY N/A 10/18/2019   Procedure: APPENDECTOMY LAPAROSCOPIC;  Surgeon: Duanne Guess, MD;  Location: [facility] ORS;  Service: General;  Laterality: N/A;    OB History   No obstetric history on file.      Home Medications    Prior to Admission medications   Medication Sig Start Date End Date Taking? Authorizing Provider  acetaminophen (TYLENOL) 325 MG tablet Take 2 tablets (650 mg total) by mouth every 6 (six) hours as needed for mild pain or headache (or temp > 100). 10/18/19   Duanne Guess, MD  docusate sodium (COLACE) 100 MG capsule Take 1 capsule (100 mg total) by mouth daily as needed for mild constipation. 10/18/19   Duanne Guess, MD  ibuprofen (ADVIL) 600 MG tablet Take 1 tablet (600 mg total) by mouth every 6 (six) hours as needed for mild pain. 10/18/19   Duanne Guess, MD  mupirocin ointment (BACTROBAN) 2 % Place 1 application into the nose 2 (two) times daily. 10/18/19   Duanne Guess, MD  ondansetron (ZOFRAN-ODT) 4 MG disintegrating tablet Take 1 tablet (4 mg total) by mouth every 6 (six) hours as needed for nausea. 10/18/19   Duanne Guess, MD   desogestrel-ethinyl estradiol (MIRCETTE) 0.15-0.02/0.01 MG (21/5) tablet Take 1 tablet by mouth at bedtime. 07/08/18 09/01/19  Linzie Collin, MD  omeprazole (PRILOSEC) 40 MG capsule Take 1 capsule (40 mg total) by mouth daily. 07/05/18 09/01/19  Schaevitz, Myra Rude, MD    Family History History reviewed. No pertinent family history.  Social History Social History   Tobacco Use   Smoking status: Every Day    Packs/day: 1.00    Years: 2.00    Pack years: 2.00    Types: E-cigarettes, Cigarettes   Smokeless tobacco: Never  Vaping Use   Vaping Use: Never used  Substance Use Topics   Alcohol use: No   Drug use: No     Allergies   Patient has no known allergies.   Review of Systems Review of Systems  Constitutional: Negative.   Skin:  Positive for wound.    Physical Exam Triage Vital Signs ED Triage Vitals  Enc Vitals Group     BP 01/01/21 1611 128/66     Pulse Rate 01/01/21 1609 68     Resp 01/01/21 1609 14     Temp 01/01/21 1609 98.3 F (36.8 C)     Temp Source 01/01/21 1609 Oral     SpO2 01/01/21 1609 99 %     Weight 01/01/21 1607 (!) 310 lb (140.6 kg)     Height 01/01/21 1607 5\' 7"  (1.702 m)  Head Circumference --      Peak Flow --      Pain Score 01/01/21 1607 6     Pain Loc --      Pain Edu? --      Excl. in GC? --    Updated Vital Signs BP 128/66 (BP Location: Left Arm)   Pulse 68   Temp 98.3 F (36.8 C) (Oral)   Resp 14   Ht 5\' 7"  (1.702 m)   Wt (!) 140.6 kg   LMP 12/04/2020 (Approximate)   SpO2 99%   BMI 48.55 kg/m   Visual Acuity Right Eye Distance:   Left Eye Distance:   Bilateral Distance:    Right Eye Near:   Left Eye Near:    Bilateral Near:     Physical Exam Vitals and nursing note reviewed.  Constitutional:      General: She is not in acute distress.    Appearance: Normal appearance. She is obese. She is not ill-appearing.  HENT:     Head: Normocephalic and atraumatic.  Eyes:     General:        Right eye: No  discharge.        Left eye: No discharge.     Conjunctiva/sclera: Conjunctivae normal.  Pulmonary:     Effort: Pulmonary effort is normal. No respiratory distress.  Skin:    Comments: Tip of the right thumb with a linear, superficial laceration measuring approximately 1 cm.  Neurological:     Mental Status: She is alert.  Psychiatric:        Mood and Affect: Mood normal.        Behavior: Behavior normal.     UC Treatments / Results  Labs (all labs ordered are listed, but only abnormal results are displayed) Labs Reviewed - No data to display  EKG   Radiology No results found.  Procedures Laceration Repair  Date/Time: 01/01/2021 4:36 PM Performed by: 01/03/2021, DO Authorized by: Tommie Sams, DO   Consent:    Consent obtained:  Verbal   Consent given by:  Patient Anesthesia:    Anesthesia method:  None Laceration details:    Location:  Finger   Finger location:  R thumb   Length (cm):  1 Exploration:    Hemostasis achieved with:  Direct pressure Treatment:    Wound cleansed with: Wound cleanser. Skin repair:    Repair method:  Tissue adhesive Approximation:    Approximation:  Close Repair type:    Repair type:  Simple Post-procedure details:    Procedure completion:  Tolerated well, no immediate complications (including critical care time)  Medications Ordered in UC Medications  Tdap (BOOSTRIX) injection 0.5 mL (0.5 mLs Intramuscular Given 01/01/21 1614)    Initial Impression / Assessment and Plan / UC Course  I have reviewed the triage vital signs and the nursing notes.  Pertinent labs & imaging results that were available during my care of the patient were reviewed by me and considered in my medical decision making (see chart for details).    20 year old female presents with superficial laceration to the tip of the right thumb.  Wound is superficial and well approximated.  Wound closure with Dermabond.  Tetanus given.  Supportive care.  Final  Clinical Impressions(s) / UC Diagnoses   Final diagnoses:  Laceration of right thumb without foreign body without damage to nail, initial encounter   Discharge Instructions   None    ED Prescriptions   None  PDMP not reviewed this encounter.   Tommie Sams, [place] 01/01/21 1639

## 2021-01-01 NOTE — ED Triage Notes (Signed)
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  Patient states that she is not doing worker's comp.

## 2021-03-07 ENCOUNTER — Encounter: Payer: Self-pay | Admitting: General Surgery

## 2021-05-30 IMAGING — US US PELVIS COMPLETE
1 series · 14 of 25 positions shown · non-contrast
Comparison: None.

CLINICAL DATA: Right lower quadrant pain

EXAM:
TRANSABDOMINAL ULTRASOUND OF PELVIS
DOPPLER ULTRASOUND OF OVARIES
TECHNIQUE: Transabdominal ultrasound examination of the pelvis were performed.
Transabdominal technique was performed for global imaging of the
pelvis including uterus, ovaries, adnexal regions, and pelvic
cul-de-sac.

[Series 1: us pelvis (transabdominal only) · 14 of 25 slices shown]
[im 1/25]
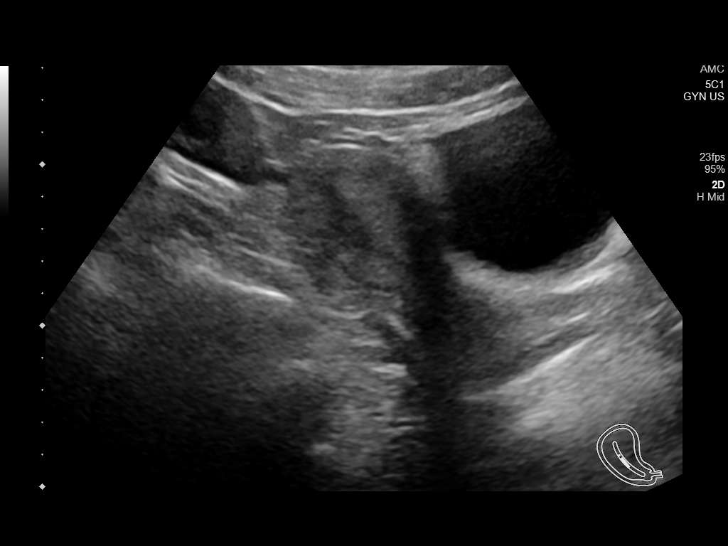
[im 3/25]
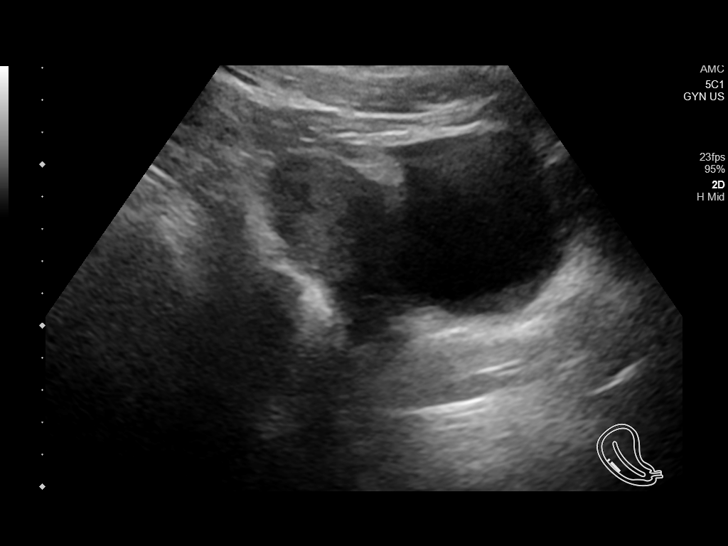
[im 5/25]
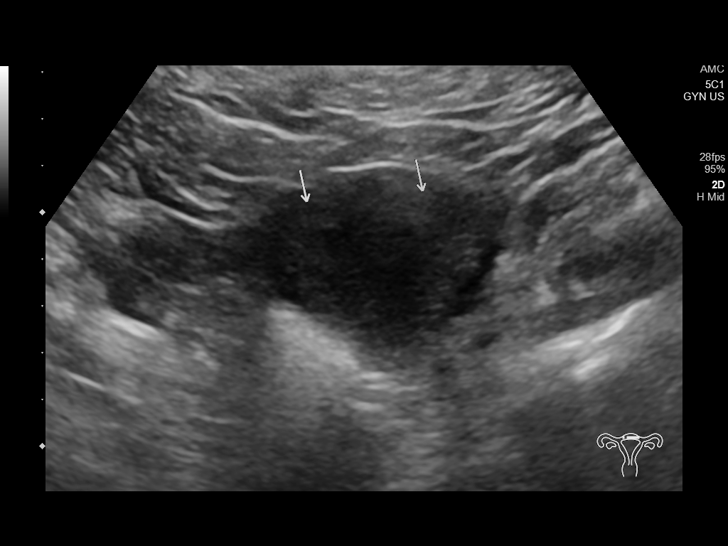
[im 7/25]
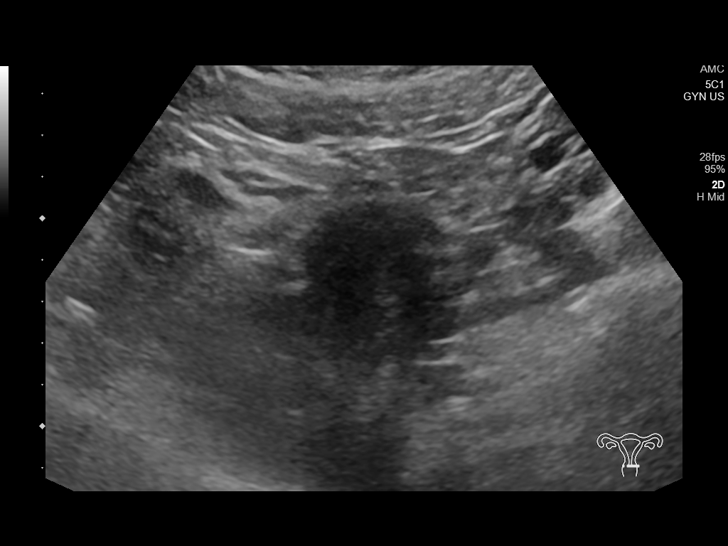
[im 9/25]
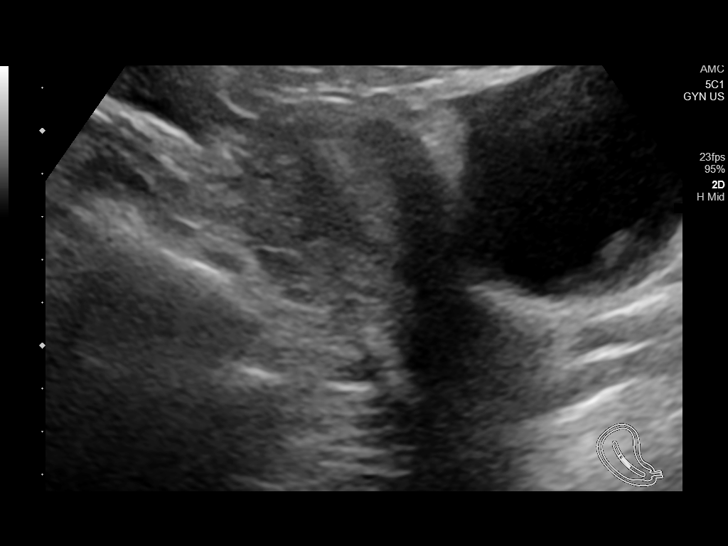
[im 10/25]
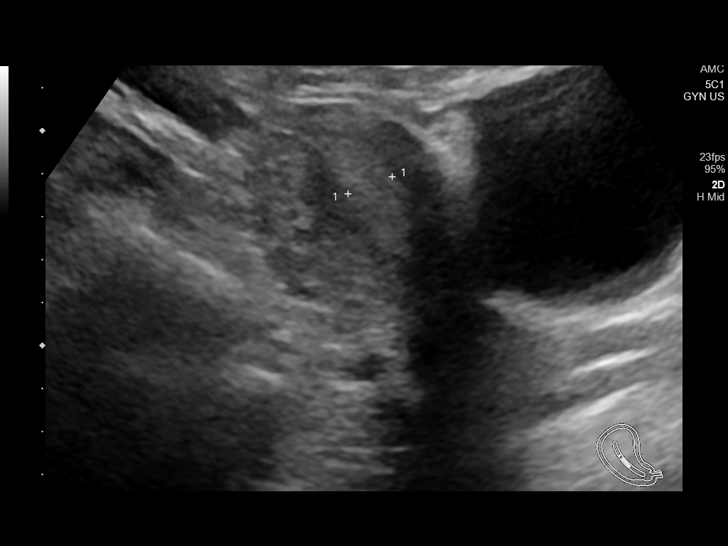
[im 12/25]
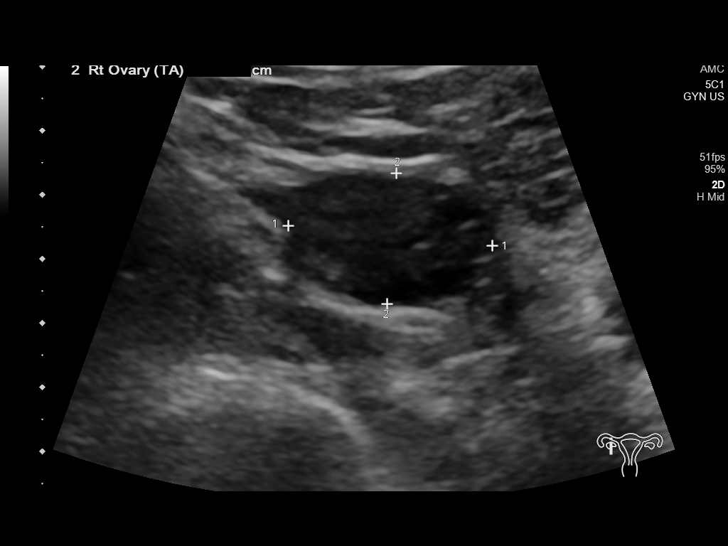
[im 14/25]
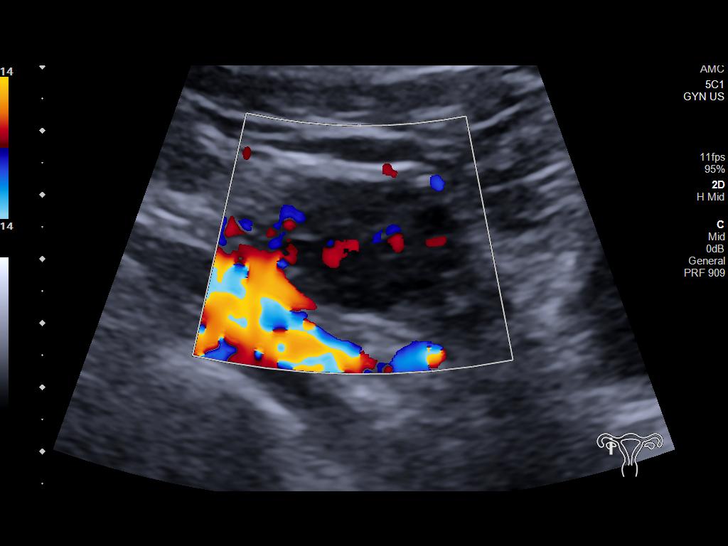
[im 16/25]
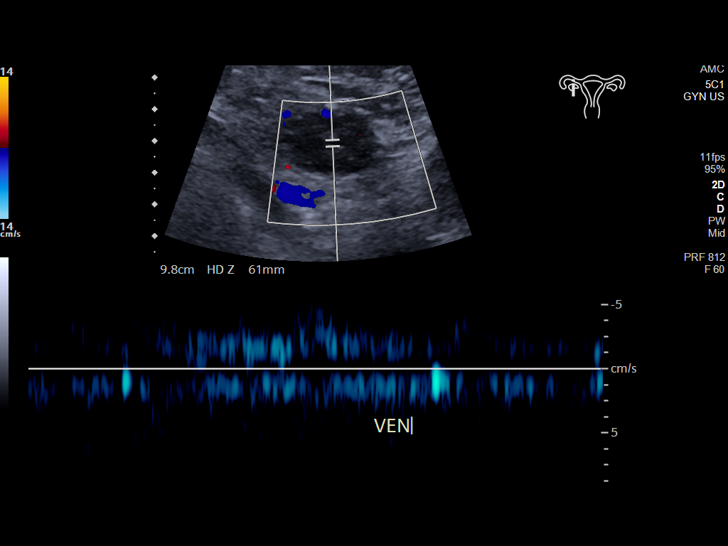
[im 17/25]
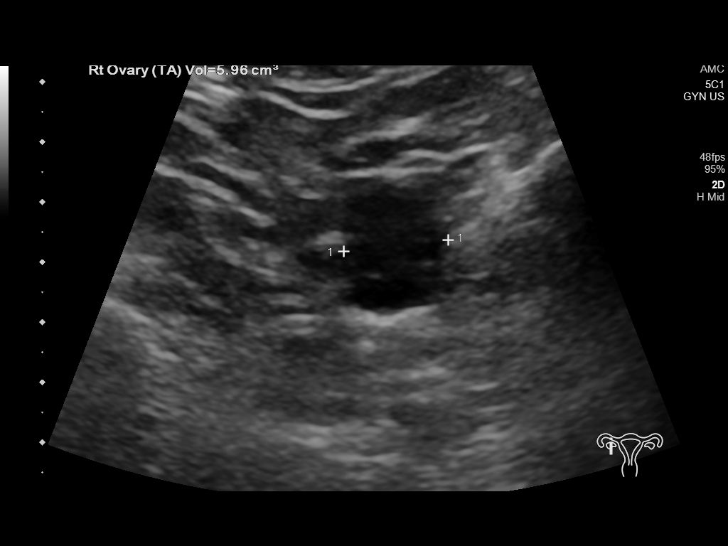
[im 19/25]
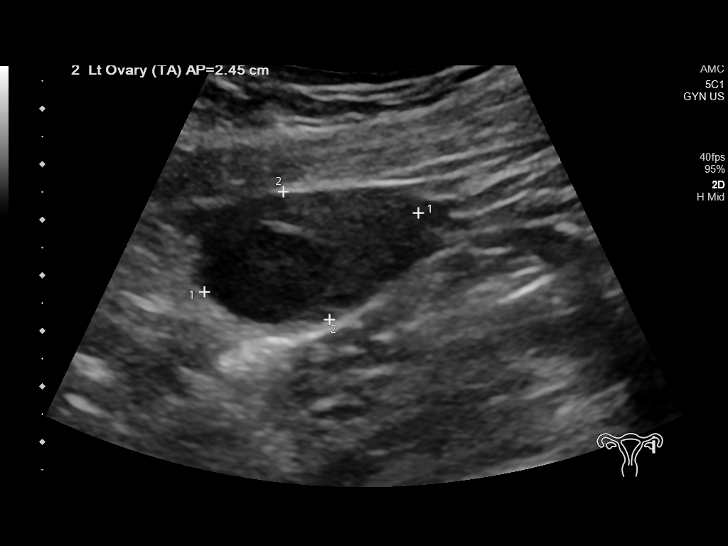
[im 21/25]
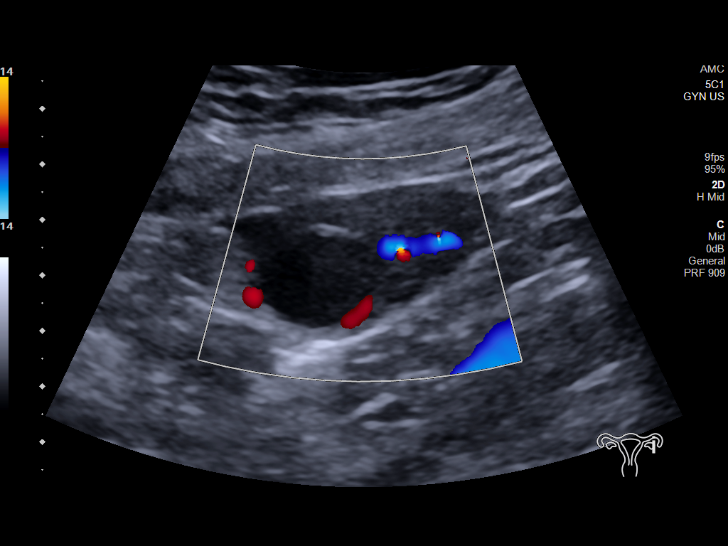
[im 23/25]
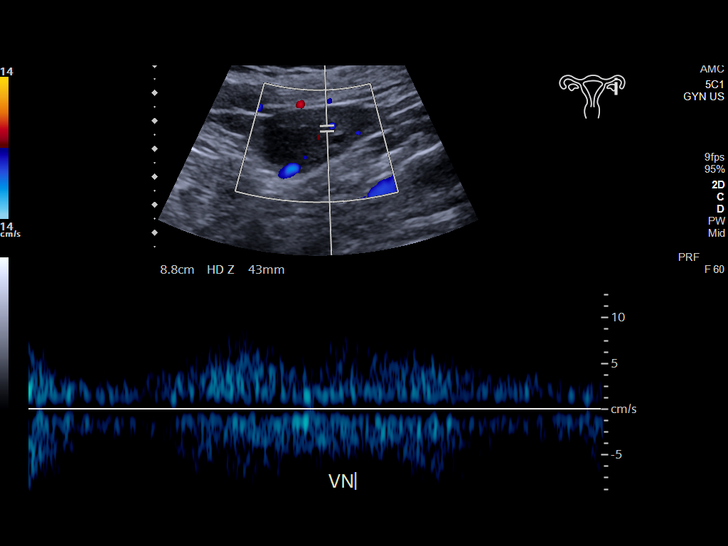
[im 25/25]
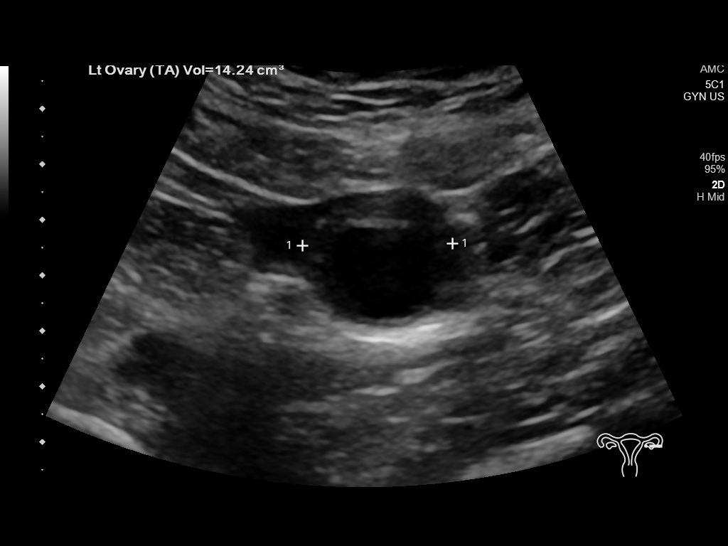

[14 of 25 positions shown; findings below may reference images not displayed]

FINDINGS: Uterus

Measurements: 8.8 x 4.1 x 4.4 cm = volume: 82 mL. Arcuate variant
anatomy of the uterus. No fibroids or other mass visualized.

Endometrium

Thickness: 11 mm.  No focal abnormality visualized.

Right ovary

Measurements: 3.2 x 2.0 x 1.8 cm = volume: 6 mL. Small follicles.
Normal appearance/no adnexal mass.

Left ovary

Measurements: 4.1 x 2.5 x 2.7 cm = volume: 14 mL. Dominant follicle
or cyst measuring 2.2 cm. Normal appearance/no adnexal mass.

Pulsed Doppler evaluation of both ovaries demonstrates normal
low-resistance arterial and venous waveforms.

Other findings

No abnormal free fluid.
IMPRESSION: 1. No ultrasound findings of the pelvis to explain right lower
quadrant pain.

2.  Incidental note of arcuate variant anatomy of the uterus.

## 2021-05-30 IMAGING — CT CT ABD-PELV W/ CM
2 of 4 series · 16 of 46 positions shown, 18 images · IV contrast (APPLIED)
Comparison: 10/22/2017

CLINICAL DATA: Right lower quadrant pain

EXAM:
CT ABDOMEN AND PELVIS WITH CONTRAST
TECHNIQUE: Multidetector CT imaging of the abdomen and pelvis was performed
using the standard protocol following bolus administration of
intravenous contrast.
CONTRAST:  125mL OMNIPAQUE IOHEXOL 300 MG/ML  SOLN

[Series 2: routine abd/pel with · axial · 0.86mm/px · z∈[-1068,-578]mm · 13 of 108 slices shown, 15 images]
[im 5/108  soft-tissue]
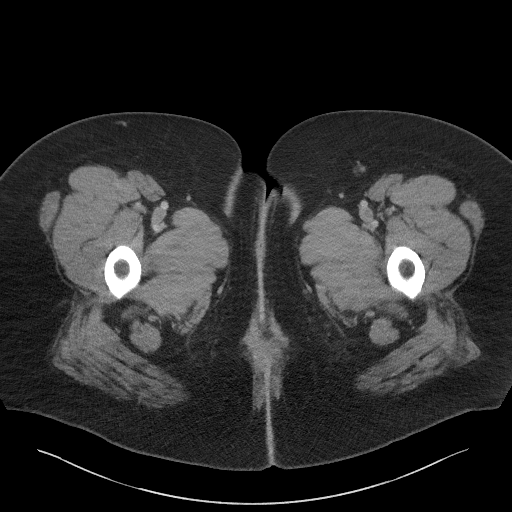
[im 5/108  bone]
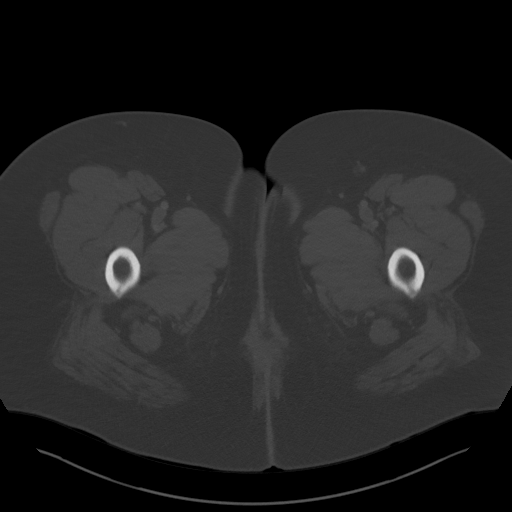
[im 14/108  soft-tissue]
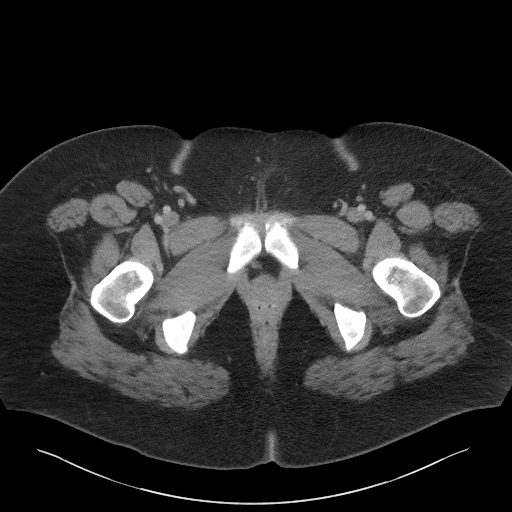
[im 23/108  soft-tissue]
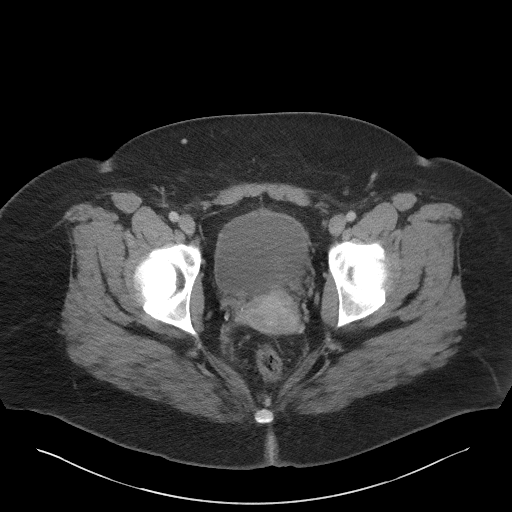
[im 32/108  soft-tissue]
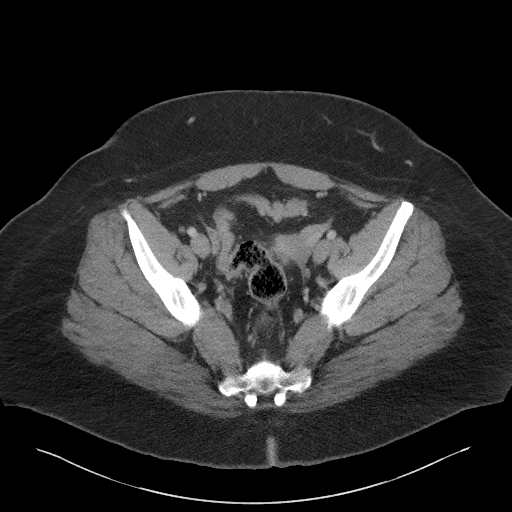
[im 36/108  soft-tissue]
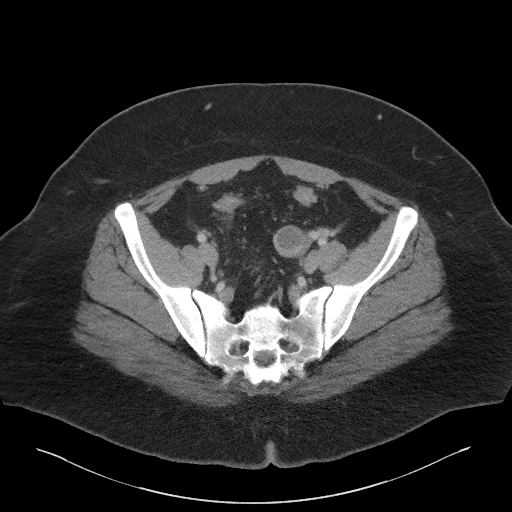
[im 45/108  soft-tissue]
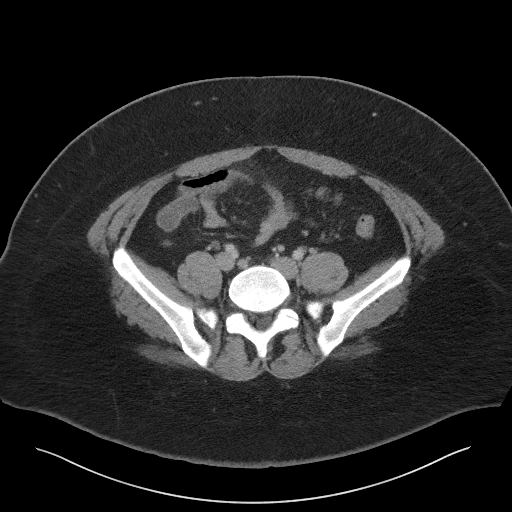
[im 54/108  soft-tissue]
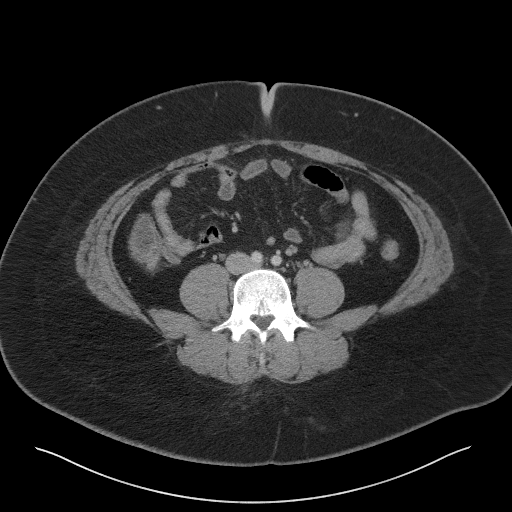
[im 63/108  soft-tissue]
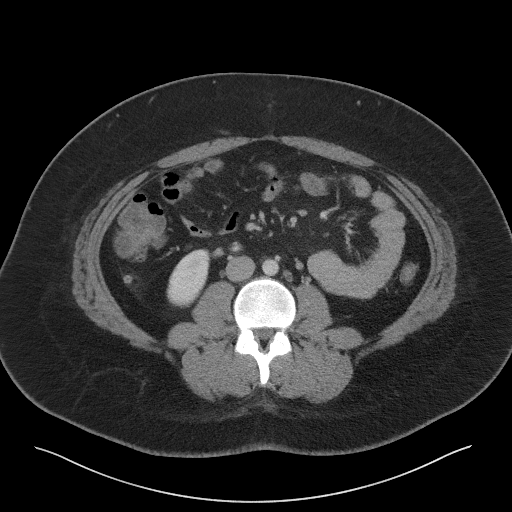
[im 72/108  soft-tissue]
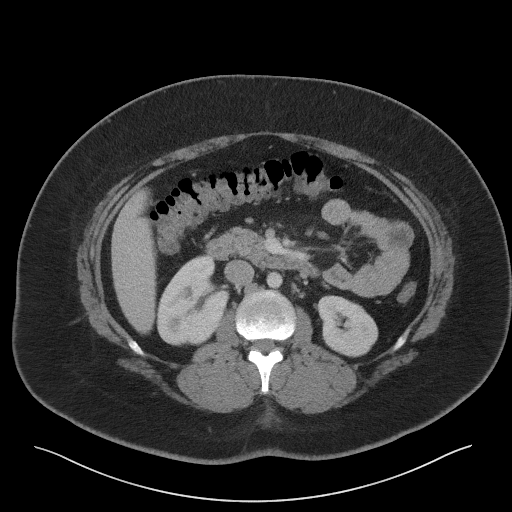
[im 72/108  bone]
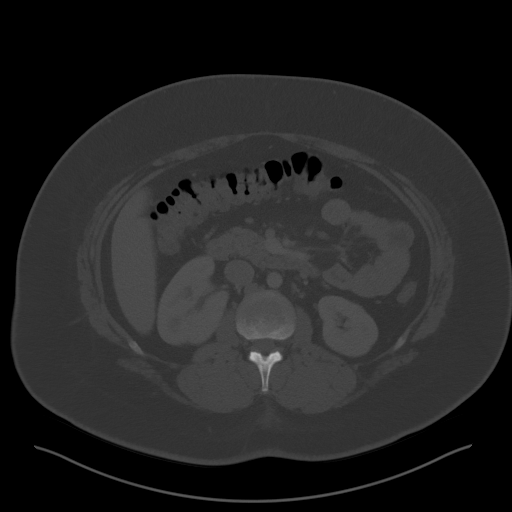
[im 76/108  soft-tissue]
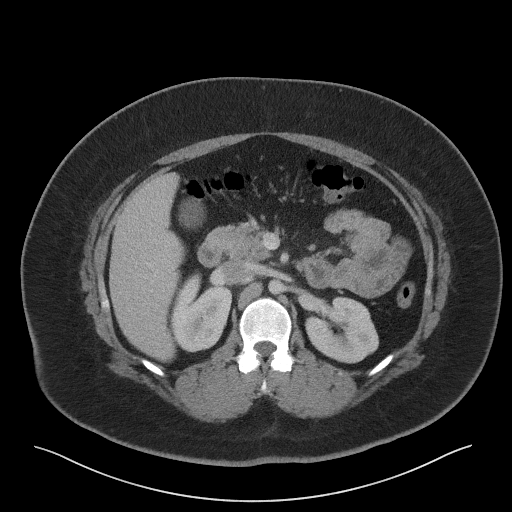
[im 85/108  soft-tissue]
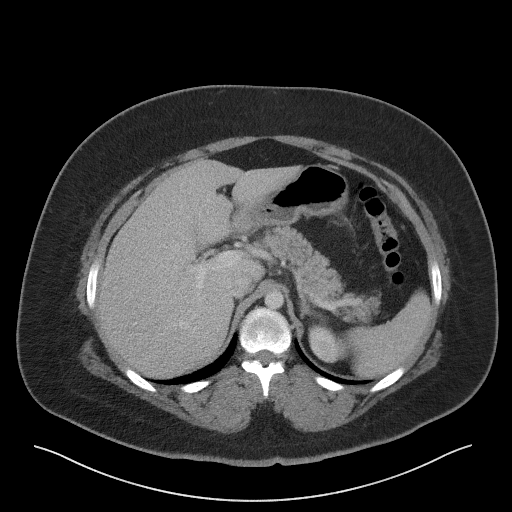
[im 94/108  soft-tissue]
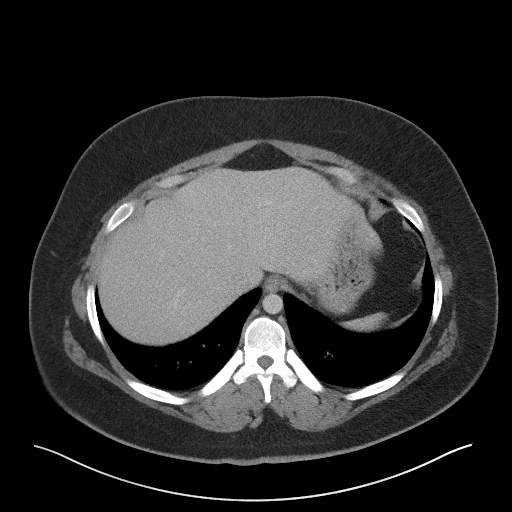
[im 103/108  soft-tissue]
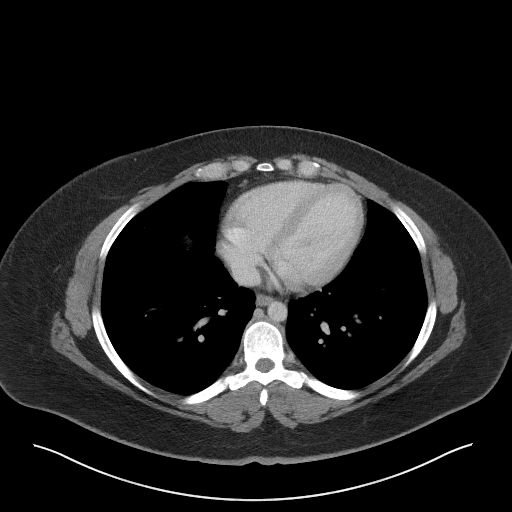

[Series 5: coronal st · coronal · 0.79mm/px · 3 of 112 slices shown]
[im 38/112  soft-tissue]
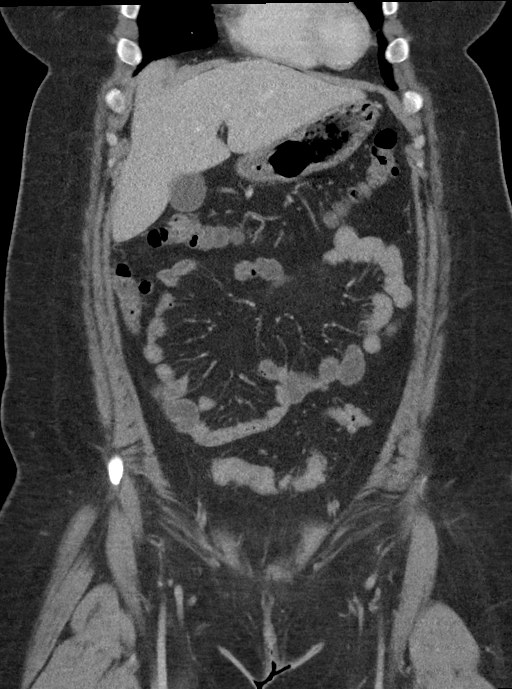
[im 50/112  soft-tissue]
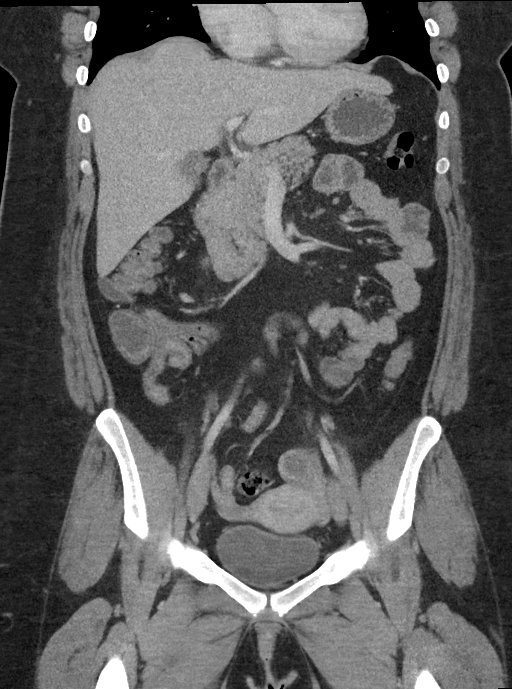
[im 62/112  soft-tissue]
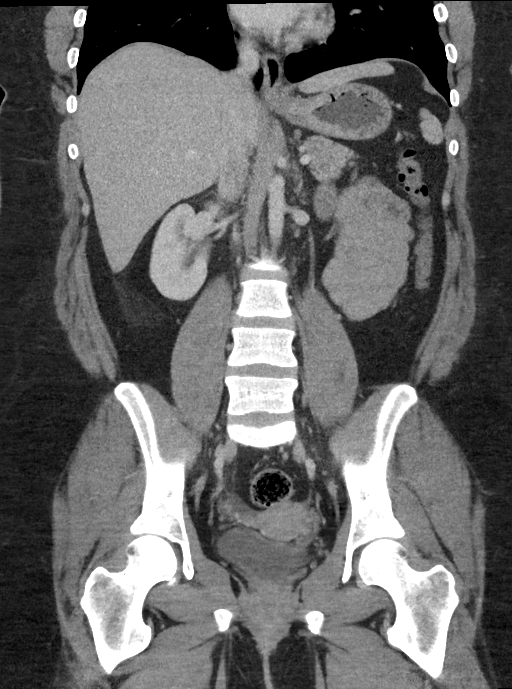

[16 of 46 positions shown; findings below may reference images not displayed]

FINDINGS: Lower chest: No acute abnormality.

Hepatobiliary: No solid liver abnormality is seen. No gallstones,
gallbladder wall thickening, or biliary dilatation.

Pancreas: Unremarkable. No pancreatic ductal dilatation or
surrounding inflammatory changes.

Spleen: Normal in size without significant abnormality.

Adrenals/Urinary Tract: Adrenal glands are unremarkable. Kidneys are
normal, without renal calculi, solid lesion, or hydronephrosis.
Bladder is unremarkable.

Stomach/Bowel: Stomach is within normal limits. The retrocecal
appendix is at the upper limits of normal in caliber at 6 mm,
increased in size compared to prior examination dated 10/22/2017,
with adjacent fat stranding (series 2, image 50).

Vascular/Lymphatic: No significant vascular findings are present. No
enlarged abdominal or pelvic lymph nodes.

Reproductive: No mass or other significant abnormality.

Other: No abdominal wall hernia or abnormality. No abdominopelvic
ascites.

Musculoskeletal: No acute or significant osseous findings.
IMPRESSION: The retrocecal appendix is at the upper limits of normal in caliber
at 6 mm, increased in size compared to prior examination dated
10/22/2017, with adjacent fat stranding. Findings are concerning for
early appendicitis. No evidence of perforation or abscess.

## 2021-09-15 ENCOUNTER — Emergency Department
Admission: EM | Admit: 2021-09-15 | Discharge: 2021-09-15 | Disposition: A | Discharge status: Home / Self Care | Payer: Medicaid Other | Attending: Emergency Medicine | Admitting: Emergency Medicine

## 2021-09-15 ENCOUNTER — Other Ambulatory Visit: Payer: Self-pay

## 2021-09-15 DIAGNOSIS — L02215 Cutaneous abscess of perineum: Secondary | ICD-10-CM | POA: Insufficient documentation

## 2021-09-15 MED ORDER — SULFAMETHOXAZOLE-TRIMETHOPRIM 800-160 MG PO TABS
1.0000 | ORAL_TABLET | Freq: Once | ORAL | Status: AC
  Administered 2021-09-15: 1 via ORAL
  Filled 2021-09-15: qty 1

## 2021-09-15 MED ORDER — LIDOCAINE HCL (PF) 1 % IJ SOLN
5.0000 mL | Freq: Once | INTRAMUSCULAR | Status: AC
  Administered 2021-09-15: 5 mL via INTRADERMAL
  Filled 2021-09-15: qty 5

## 2021-09-15 NOTE — ED Triage Notes (Signed)
Pt reports has an abscess under the skin on her right thigh. Pt reports went to UC and was given abx but could not afford to get them filled so she has not taken any. Pt reports area is no better.  ?

## 2021-09-15 NOTE — ED Provider Notes (Signed)
? ?[facility] ?Provider Note ? ? ? Event Date/Time  ? First MD Initiated Contact with Patient 09/15/21 1739   ?  (approximate) ? ? ?History  ? ?Abscess ? ?Robin Ward is a 21 y.o. female presents to the ER today with complaint of an abscess to her right groin.  She noticed this a little over 1 week ago.  She saw her PCP for the same 09/13/2021.  The area was not I&D at that time.  She was started on Septra DS but reports she never picked up this medication as she could not afford it.  She was referred to general surgery but has not heard anything about this appointment.  She reports increased pain and swelling.  She denies fever, chills, nausea or vomiting.  She has not tried anything OTC for this. ? ?  ? ? ?Physical Exam  ? ?Triage Vital Signs: ?ED Triage Vitals  ?Enc Vitals Group  ?   BP 09/15/21 1713 138/73  ?   Pulse Rate 09/15/21 1713 86  ?   Resp 09/15/21 1713 18  ?   Temp 09/15/21 1713 98.3 ?F (36.8 ?C)  ?   Temp src --   ?   SpO2 09/15/21 1713 99 %  ?   Weight 09/15/21 1712 (!) 313 lb (142 kg)  ?   Height 09/15/21 1712 5\' 7"  (1.702 m)  ?   Head Circumference --   ?   Peak Flow --   ?   Pain Score 09/15/21 1712 7  ?   Pain Loc --   ?   Pain Edu? --   ?   Excl. in GC? --   ? ? ?Most recent vital signs: ?Vitals:  ? 09/15/21 1713  ?BP: 138/73  ?Pulse: 86  ?Resp: 18  ?Temp: 98.3 ?F (36.8 ?C)  ?SpO2: 99%  ? ? ? ?General: Awake, appears uncomfortable but in NAD. ?CV:  RRR, no murmur noted. ?Resp:  Normal effort.  CTA bilaterally. ?Skin:  3.5 x 4.5 cm abscess noted of right perineum  Multiple heads noted within this abscess.  Soft and fluctuant.  No evidence of cellulitis. ? ? ?ED Results / Procedures / Treatments  ? ? ?PROCEDURES: ? ? ?09/17/21.Incision and Drainage ? ?Date/Time: 09/15/2021 6:50 PM ?Performed by: 09/17/2021, NP ?Authorized by: Lorre Munroe, NP  ? ?Consent:  ?  Consent obtained:  Verbal ?  Consent given by:  Patient ?  Risks, benefits, and alternatives were discussed: yes    ?  Risks discussed:  Bleeding, incomplete drainage, pain and infection ?  Alternatives discussed:  Alternative treatment ?Universal protocol:  ?  Procedure explained and questions answered to patient or proxy's satisfaction: yes   ?  Immediately prior to procedure, a time out was called: yes   ?  Patient identity confirmed:  Verbally with patient and arm band ?Location:  ?  Type:  Abscess ?  Size:  3.5 x 4.5 ?  Location:  Anogenital ?  Anogenital location:  Perineum ?Pre-procedure details:  ?  Skin preparation:  Povidone-iodine ?Sedation:  ?  Sedation type:  None ?Anesthesia:  ?  Anesthesia method:  Local infiltration ?  Local anesthetic:  Lidocaine 1% w/o epi ?Procedure type:  ?  Complexity:  Simple ?Procedure details:  ?  Ultrasound guidance: no   ?  Needle aspiration: no   ?  Incision types:  Single straight ?  Incision depth:  Dermal ?  Wound management:  Probed and  deloculated ?  Drainage:  Bloody and purulent ?  Drainage amount:  Moderate ?  Wound treatment:  Wound left open ?  Packing materials:  None ?Post-procedure details:  ?  Procedure completion:  Tolerated well, no immediate complications ? ? ?MEDICATIONS ORDERED IN ED: ?Medications  ?sulfamethoxazole-trimethoprim (BACTRIM DS) 800-160 MG per tablet 1 tablet (has no administration in time range)  ?lidocaine (PF) (XYLOCAINE) 1 % injection 5 mL (5 mLs Intradermal Given 09/15/21 1812)  ? ? ? ?IMPRESSION / MDM / ASSESSMENT AND PLAN / ED COURSE  ?I reviewed the triage vital signs and the nursing notes. ? ?Abscess, Right Groin: ? ?I&D performed, see procedure note ?Septra DS 1 tab PO x 1 given in ER ?Advised her to pick up her prescription and take as prescribed ?Encourage warm compresses for 10 minutes 3 times daily ?Follow-up with general surgeon as previously referred to ? ? ?FINAL CLINICAL IMPRESSION(S) / ED DIAGNOSES  ? ?Final diagnoses:  ?Perineal abscess  ? ? ? ?Rx / DC Orders  ? ?ED Discharge Orders   ? ? None  ? ?  ? ? ? ?Note:  This document was  prepared using Dragon voice recognition software and may include unintentional dictation errors. ? ?  ?Lorre Munroe, NP ?09/15/21 1854 ? ?  ?Phineas Semen, MD ?09/15/21 2111 ? ?

## 2021-09-15 NOTE — Discharge Instructions (Signed)
You were seen today for an abscess of the perineal area.  This was incised and drained.  You may notice bloody/purulent drainage from the area over the next few days.  I encourage warm compresses or hot baths for 10 minutes 3 times daily to help encourage drainage.  Please pick up the antibiotic that was previously prescribed and take this until your course has been completed.  Please follow-up with your PCP or general surgeon as previously referred to for further evaluation. ?

## 2022-01-09 ENCOUNTER — Other Ambulatory Visit: Payer: Self-pay

## 2022-01-09 DIAGNOSIS — L02214 Cutaneous abscess of groin: Secondary | ICD-10-CM | POA: Insufficient documentation

## 2022-01-09 DIAGNOSIS — Z5321 Procedure and treatment not carried out due to patient leaving prior to being seen by health care provider: Secondary | ICD-10-CM | POA: Insufficient documentation

## 2022-01-09 NOTE — ED Triage Notes (Signed)
Ambulatory to triage with abscess x 2-3 days to left groin area. Yellow drainage at home. Area is swollen and red, warm to touch.

## 2022-01-10 ENCOUNTER — Emergency Department
Admission: EM | Admit: 2022-01-10 | Discharge: 2022-01-10 | Discharge status: Against Medical Advice | Payer: Medicaid Other | Attending: Emergency Medicine | Admitting: Emergency Medicine

## 2022-01-10 NOTE — ED Notes (Signed)
No answer in WR x3

## 2022-01-10 NOTE — ED Notes (Signed)
Pt called for VS with no answer.  ?

## 2022-01-15 ENCOUNTER — Ambulatory Visit: Payer: Self-pay

## 2022-12-11 ENCOUNTER — Ambulatory Visit: Admit: 2022-12-11 | Payer: Medicaid Other

## 2023-02-17 ENCOUNTER — Ambulatory Visit
Admission: RE | Admit: 2023-02-17 | Discharge: 2023-02-17 | Disposition: A | Discharge status: Home / Self Care | Payer: Medicaid Other | Source: Ambulatory Visit | Attending: Emergency Medicine | Admitting: Emergency Medicine

## 2023-02-17 VITALS — BP 129/92 | HR 62 | Temp 98.5°F | Resp 14 | Ht 67.0 in | Wt 328.0 lb

## 2023-02-17 DIAGNOSIS — M25561 Pain in right knee: Secondary | ICD-10-CM | POA: Diagnosis not present

## 2023-02-17 MED ORDER — PREDNISONE 20 MG PO TABS: 40.0000 mg | ORAL_TABLET | Freq: Every day | ORAL | 0 refills | Status: DC

## 2023-02-17 NOTE — Discharge Instructions (Addendum)
Your evaluated for your knee pain which is most likely caused by irritation to the joint, should improve with time  Begin prednisone every morning with food to help reduce inflammation which should help with pain, may take Tylenol 500 to 1000 mg every 6 hours in addition to this as needed  Fortunately we have run out of knee compression sleeves and you have been given an Ace bandage that may be used as an alternative, you may also purchase a compression sleeve at the local pharmacies  You may use heating pad in 15 minute intervals as needed for additional comfort, or  you may find comfort in using ice in 10-15 minutes over affected area  Begin stretching affected area daily for 10 minutes as tolerated to further loosen muscles   When l sitting and lying ying down place pillow underneath and between knees for support  Practice good posture: head back, shoulders back, chest forward, pelvis back and weight distributed evenly on both legs  If pain persist after recommended treatment or reoccurs if may be beneficial to follow up with orthopedic specialist for evaluation, this doctor specializes in the bones and can manage your symptoms long-term with options such as but not limited to imaging, medications or physical therapy

## 2023-02-17 NOTE — ED Triage Notes (Signed)
Patient c/o right knee pain and swelling that started 2 weeks ago.  Patient denies fall.

## 2023-02-17 NOTE — ED Provider Notes (Signed)
[facility]    CSN: [id number] Arrival date & time: 02/17/23  1248      History   Chief Complaint Chief Complaint  Patient presents with   Leg Swelling    Appointment   Knee Pain    HPI Robin Ward is a 22 y.o. female.   Patient presents for evaluation of anterior right knee pain and swelling present for 2 weeks.  Symptoms began while walking down the stairs in which knee was hyperextended, denies fall or direct injury.  Have been constant since, fluctuating in intensity.  Exacerbated by long periods of walking and standing.  Has limitations on bending of the knee.  Intermittently experiencing a popping sensation.  Denies presence of numbness or tingling.  Has attempted use of ibuprofen which has been somewhat helpful.    History reviewed. No pertinent past medical history.  Patient Active Problem List   Diagnosis Date Noted   Acute appendicitis 10/17/2019   Elevated blood-pressure reading without diagnosis of hypertension 12/02/2016    Past Surgical History:  Procedure Laterality Date   LAPAROSCOPIC APPENDECTOMY N/A 10/18/2019   Procedure: APPENDECTOMY LAPAROSCOPIC;  Surgeon: Duanne Guess, MD;  Location: [facility] ORS;  Service: General;  Laterality: N/A;    OB History   No obstetric history on file.      Home Medications    Prior to Admission medications   Medication Sig Start Date End Date Taking? Authorizing Provider  acetaminophen (TYLENOL) 325 MG tablet Take 2 tablets (650 mg total) by mouth every 6 (six) hours as needed for mild pain or headache (or temp > 100). 10/18/19   Duanne Guess, MD  docusate sodium (COLACE) 100 MG capsule Take 1 capsule (100 mg total) by mouth daily as needed for mild constipation. 10/18/19   Duanne Guess, MD  ibuprofen (ADVIL) 600 MG tablet Take 1 tablet (600 mg total) by mouth every 6 (six) hours as needed for mild pain. 10/18/19   Duanne Guess, MD  mupirocin ointment (BACTROBAN) 2 % Place 1 application into the  nose 2 (two) times daily. 10/18/19   Duanne Guess, MD  ondansetron (ZOFRAN-ODT) 4 MG disintegrating tablet Take 1 tablet (4 mg total) by mouth every 6 (six) hours as needed for nausea. 10/18/19   Duanne Guess, MD  desogestrel-ethinyl estradiol (MIRCETTE) 0.15-0.02/0.01 MG (21/5) tablet Take 1 tablet by mouth at bedtime. 07/08/18 09/01/19  Linzie Collin, MD  omeprazole (PRILOSEC) 40 MG capsule Take 1 capsule (40 mg total) by mouth daily. 07/05/18 09/01/19  Schaevitz, Myra Rude, MD    Family History History reviewed. No pertinent family history.  Social History Social History   Tobacco Use   Smoking status: Every Day    Current packs/day: 1.00    Average packs/day: 1 pack/day for 2.0 years (2.0 ttl pk-yrs)    Types: E-cigarettes, Cigarettes   Smokeless tobacco: Never  Vaping Use   Vaping status: Never Used  Substance Use Topics   Alcohol use: No   Drug use: No     Allergies   Patient has no known allergies.   Review of Systems Review of Systems   Physical Exam Triage Vital Signs ED Triage Vitals  Encounter Vitals Group     BP 02/17/23 1323 (!) 129/92     Systolic BP Percentile --      Diastolic BP Percentile --      Pulse Rate 02/17/23 1323 62     Resp 02/17/23 1323 14     Temp 02/17/23 1323 98.5 F (  36.9 C)     Temp Source 02/17/23 1323 Oral     SpO2 02/17/23 1323 98 %     Weight 02/17/23 1322 (!) 328 lb 0.7 oz (148.8 kg)     Height 02/17/23 1322 5\' 7"  (1.702 m)     Head Circumference --      Peak Flow --      Pain Score 02/17/23 1321 5     Pain Loc --      Pain Education --      Exclude from Growth Chart --    No data found.  Updated Vital Signs BP (!) 129/92 (BP Location: Left Arm)   Pulse 62   Temp 98.5 F (36.9 C) (Oral)   Resp 14   Ht 5\' 7"  (1.702 m)   Wt (!) 328 lb 0.7 oz (148.8 kg)   SpO2 98%   BMI 51.38 kg/m   Visual Acuity Right Eye Distance:   Left Eye Distance:   Bilateral Distance:    Right Eye Near:   Left Eye Near:     Bilateral Near:     Physical Exam Constitutional:      Appearance: Normal appearance.  Eyes:     Extraocular Movements: Extraocular movements intact.  Pulmonary:     Effort: Pulmonary effort is normal.  Musculoskeletal:     Comments: Mild to moderate lower anterior knee swelling and tenderness, no ecchymosis or deformity noted, able to bear weight to the lower extremity, pain elicited with flexion, able to fully extend, 2+ popliteal pulse, no effusion noted  Neurological:     Mental Status: She is alert and oriented to person, place, and time. Mental status is at baseline.      UC Treatments / Results  Labs (all labs ordered are listed, but only abnormal results are displayed) Labs Reviewed - No data to display  EKG   Radiology No results found.  Procedures Procedures (including critical care time)  Medications Ordered in UC Medications - No data to display  Initial Impression / Assessment and Plan / UC Course  I have reviewed the triage vital signs and the nursing notes.  Pertinent labs & imaging results that were available during my care of the patient were reviewed by me and considered in my medical decision making (see chart for details).  Acute right knee pain  Etiology most likely inflammatory, will defer imaging as no injury occurred most likely irritation to the joint space, history of morbid obesity, Ace bandage given to be used for stability and support, prescribed prednisone for treatment, recommended RICE, heat massage stretching and activity as tolerated, given walking referral to orthopedics if symptoms continue to persist or worsen Final Clinical Impressions(s) / UC Diagnoses   Final diagnoses:  None   Discharge Instructions   None    ED Prescriptions   None    PDMP not reviewed this encounter.   Valinda Hoar, [place] 02/20/23 [phone]

## 2023-09-03 ENCOUNTER — Other Ambulatory Visit (HOSPITAL_COMMUNITY)
Admission: RE | Admit: 2023-09-03 | Discharge: 2023-09-03 | Disposition: A | Discharge status: Home / Self Care | Source: Ambulatory Visit | Attending: Certified Nurse Midwife | Admitting: Certified Nurse Midwife

## 2023-09-03 ENCOUNTER — Ambulatory Visit: Admitting: Certified Nurse Midwife

## 2023-09-03 VITALS — BP 137/56 | HR 73 | Wt 327.6 lb

## 2023-09-03 DIAGNOSIS — Z113 Encounter for screening for infections with a predominantly sexual mode of transmission: Secondary | ICD-10-CM | POA: Diagnosis present

## 2023-09-03 DIAGNOSIS — N898 Other specified noninflammatory disorders of vagina: Secondary | ICD-10-CM | POA: Diagnosis not present

## 2023-09-03 NOTE — Progress Notes (Signed)
    GYNECOLOGY PROGRESS NOTE  Subjective:    Patient ID: Robin Ward, female    DOB: [birth year], 23 y.o.   MRN: [id number]  HPI  Patient is a 23 y.o. No obstetric history on file. female who presents for evaluation of "bump near vagina." Currently being treated abscess of that area with doxycycline. Reports that in 2018 she had [facility] told her it was herpes but did not do any lab tests. Desires to know HSV status today.   The following portions of the patient's history were reviewed and updated as appropriate: allergies, current medications, past family history, past medical history, past social history, past surgical history, and problem list.  Review of Systems Pertinent items are noted in HPI.   Objective:   Blood pressure (!) 137/56, pulse 73, weight (!) 327 lb 9.6 oz (148.6 kg). Body mass index is 51.31 kg/m. General appearance: alert and cooperative Abdomen: soft, non-tender; bowel sounds normal; no masses,  no organomegaly Pelvic: external genitalia normal, no adnexal masses or tenderness, uterus normal size, shape, and consistency, and vagina normal without discharge    Assessment:   1. Screening examination for STD (sexually transmitted disease)      Plan:   1. Screening examination for STD (sexually transmitted disease) (Primary) - HSV 1 and 2 Ab, IgG - HEP, RPR, HIV Panel - Cervicovaginal ancillary only

## 2023-09-04 LAB — HSV 1 AND 2 AB, IGG
HSV 1 Glycoprotein G Ab, IgG: NONREACTIVE
HSV 2 IgG, Type Spec: REACTIVE — AB

## 2023-09-04 LAB — HEP, RPR, HIV PANEL
HIV Screen 4th Generation wRfx: NONREACTIVE
Hepatitis B Surface Ag: NEGATIVE
RPR Ser Ql: NONREACTIVE

## 2023-09-05 LAB — CERVICOVAGINAL ANCILLARY ONLY
Bacterial Vaginitis (gardnerella): NEGATIVE
Candida Glabrata: NEGATIVE
Candida Vaginitis: NEGATIVE
Chlamydia: NEGATIVE
Comment: NEGATIVE
Comment: NEGATIVE
Comment: NEGATIVE
Comment: NEGATIVE
Comment: NEGATIVE
Comment: NORMAL
Neisseria Gonorrhea: NEGATIVE
Trichomonas: POSITIVE — AB

## 2023-09-09 ENCOUNTER — Other Ambulatory Visit: Payer: Self-pay | Admitting: Certified Nurse Midwife

## 2023-09-09 MED ORDER — METRONIDAZOLE 500 MG PO TABS: 500.0000 mg | ORAL_TABLET | Freq: Two times a day (BID) | ORAL | 0 refills | Status: AC

## 2023-09-27 ENCOUNTER — Other Ambulatory Visit: Payer: Self-pay | Admitting: Certified Nurse Midwife

## 2023-09-27 MED ORDER — VALACYCLOVIR HCL 500 MG PO TABS: 500.0000 mg | ORAL_TABLET | Freq: Every day | ORAL | 1 refills | Status: AC

## 2023-10-08 ENCOUNTER — Ambulatory Visit
Admit: 2023-10-08 | Discharge: 2023-10-08 | Disposition: A | Discharge status: Home / Self Care | Attending: Emergency Medicine | Admitting: Emergency Medicine

## 2023-10-08 ENCOUNTER — Encounter: Payer: Self-pay | Admitting: Emergency Medicine

## 2023-10-08 ENCOUNTER — Ambulatory Visit
Admission: RE | Admit: 2023-10-08 | Discharge: 2023-10-08 | Disposition: A | Discharge status: Home / Self Care | Source: Ambulatory Visit | Attending: Emergency Medicine | Admitting: Emergency Medicine

## 2023-10-08 VITALS — BP 117/73 | HR 76 | Temp 97.8°F | Resp 18

## 2023-10-08 DIAGNOSIS — M7121 Synovial cyst of popliteal space [Baker], right knee: Secondary | ICD-10-CM | POA: Diagnosis present

## 2023-10-08 DIAGNOSIS — M79609 Pain in unspecified limb: Secondary | ICD-10-CM | POA: Diagnosis not present

## 2023-10-08 LAB — COMPREHENSIVE METABOLIC PANEL WITH GFR
ALT: 20 U/L (ref 0–44)
AST: 20 U/L (ref 15–41)
Albumin: 4.3 g/dL (ref 3.5–5.0)
Alkaline Phosphatase: 48 U/L (ref 38–126)
Anion gap: 10 (ref 5–15)
BUN: 14 mg/dL (ref 6–20)
CO2: 22 mmol/L (ref 22–32)
Calcium: 9.5 mg/dL (ref 8.9–10.3)
Chloride: 105 mmol/L (ref 98–111)
Creatinine, Ser: 0.85 mg/dL (ref 0.44–1.00)
GFR, Estimated: >60 mL/min (ref >60)
Glucose, Bld: 104 mg/dL — ABNORMAL HIGH (ref 70–99)
Potassium: 4 mmol/L (ref 3.5–5.1)
Sodium: 137 mmol/L (ref 135–145)
Total Bilirubin: 0.3 mg/dL (ref 0.0–1.2)
Total Protein: 8.2 g/dL — ABNORMAL HIGH (ref 6.5–8.1)

## 2023-10-08 LAB — CBC WITH DIFFERENTIAL/PLATELET
Abs Immature Granulocytes: 0.03 10*3/uL (ref 0.00–0.07)
Basophils Absolute: 0.1 10*3/uL (ref 0.0–0.1)
Basophils Relative: 1 %
Eosinophils Absolute: 0.1 10*3/uL (ref 0.0–0.5)
Eosinophils Relative: 1 %
HCT: 45.3 % (ref 36.0–46.0)
Hemoglobin: 14.7 g/dL (ref 12.0–15.0)
Immature Granulocytes: 0 %
Lymphocytes Relative: 21 %
Lymphs Abs: 1.8 10*3/uL (ref 0.7–4.0)
MCH: 30.2 pg (ref 26.0–34.0)
MCHC: 32.5 g/dL (ref 30.0–36.0)
MCV: 93.2 fL (ref 80.0–100.0)
Monocytes Absolute: 0.8 10*3/uL (ref 0.1–1.0)
Monocytes Relative: 9 %
Neutro Abs: 5.8 10*3/uL (ref 1.7–7.7)
Neutrophils Relative %: 68 %
Platelets: 291 10*3/uL (ref 150–400)
RBC: 4.86 MIL/uL (ref 3.87–5.11)
RDW: 12.9 % (ref 11.5–15.5)
WBC: 8.5 10*3/uL (ref 4.0–10.5)
nRBC: 0 % (ref 0.0–0.2)

## 2023-10-08 MED ORDER — IBUPROFEN 600 MG PO TABS: 600.0000 mg | ORAL_TABLET | Freq: Three times a day (TID) | ORAL | 0 refills | Status: AC | PRN

## 2023-10-08 NOTE — ED Provider Notes (Signed)
 HPI  SUBJECTIVE:  Robin Ward is a 23 y.o. female who presents with 1 week of constant right posterior calf and knee pain, popliteal swelling described as soreness.  She states the pain goes to her anterior knee when her knee pops.  No trauma, twisting, fall, change in physical activity.  No calf swelling, chest pain, shortness of breath.  No erythema, bruising, fevers.  She has been taking ibuprofen  400 mg every 6 hours with improvement in her symptoms.  Symptoms are worse with sitting, standing and with flexion/extension.  No surgery in the past 4 weeks, recent immobilization, exogenous estrogen.  She does not smoke tobacco, but vapes.  No history of cancer, DVT, PE, Baker's cyst.  She has a past medical history of right knee sprain x 2, and a BMI above 30.  LMP: 4/12.  Denies the possibility of being pregnant.  PCP: [facility].    History reviewed. No pertinent past medical history.  Past Surgical History:  Procedure Laterality Date   LAPAROSCOPIC APPENDECTOMY N/A 10/18/2019   Procedure: APPENDECTOMY LAPAROSCOPIC;  Surgeon: Mercy Stall, MD;  Location: [facility] ORS;  Service: General;  Laterality: N/A;    History reviewed. No pertinent family history.  Social History   Tobacco Use   Smoking status: Former    Current packs/day: 1.00    Average packs/day: 1 pack/day for 2.0 years (2.0 ttl pk-yrs)    Types: Cigarettes   Smokeless tobacco: Never  Vaping Use   Vaping status: Every Day  Substance Use Topics   Alcohol use: No   Drug use: No    No current facility-administered medications for this encounter.  Current Outpatient Medications:    ibuprofen  (ADVIL ) 600 MG tablet, Take 1 tablet (600 mg total) by mouth every 8 (eight) hours as needed., Disp: 30 tablet, Rfl: 0   acetaminophen  (TYLENOL ) 325 MG tablet, Take 2 tablets (650 mg total) by mouth every 6 (six) hours as needed for mild pain or headache (or temp > 100)., Disp: 30 tablet, Rfl: 0   valACYclovir  (VALTREX ) 500  MG tablet, Take 1 tablet (500 mg total) by mouth daily. Take daily for suppression of lesions or can take with signs of an outbreak, Disp: 90 tablet, Rfl: 1  No Known Allergies   ROS  As noted in HPI.   Physical Exam  BP 117/73 (BP Location: Left Arm)   Pulse 76   Temp 97.8 F (36.6 C) (Oral)   Resp 18   LMP 09/28/2023   SpO2 99%   Constitutional: Well developed, well nourished, no acute distress Eyes:  EOMI, conjunctiva normal bilaterally HENT: Normocephalic, atraumatic,mucus membranes moist Respiratory: Normal inspiratory effort Cardiovascular: Normal rate GI: nondistended skin: No rash, skin intact Musculoskeletal: R Knee ROM baseline for Pt, Flexion  intact but painful, Patella NT,  Patellar tendon NT, Medial joint mildly tender, Lateral joint mildly tender, Popliteal region tender with no appreciable swelling compared to the left, Varus MCL stress testing stable, Valgus LCL stress testing stable, ACL/PCL stable, McMurray negative,  Distal NVI with intact baseline sensation / motor / pulse distal to knee.  No effusion. No erythema. No increased temperature. No crepitus.  Trace edema right lower extremity.  Right calf 51 cm, left calf 49 cm. Neurologic: Alert & oriented x 3, no focal neuro deficits Psychiatric: Speech and behavior appropriate   ED Course   Medications - No data to display  Orders Placed This Encounter  Procedures   US  Venous Img Lower Unilateral Right  Standing Status:   Standing    Number of Occurrences:   1    Reason for Exam (SYMPTOM  OR DIAGNOSIS REQUIRED):   Popliteal pain and tenderness, tenderness along the deep venous system.  Rule out DVT versus Baker's cyst   Comprehensive metabolic panel    Standing Status:   Standing    Number of Occurrences:   1   CBC with Differential    Standing Status:   Standing    Number of Occurrences:   1    Results for orders placed or performed during the hospital encounter of 10/08/23 (from the past 24  hours)  Comprehensive metabolic panel     Status: Abnormal   Collection Time: 10/08/23 11:30 AM  Result Value Ref Range   Sodium 137 135 - 145 mmol/L   Potassium 4.0 3.5 - 5.1 mmol/L   Chloride 105 98 - 111 mmol/L   CO2 22 22 - 32 mmol/L   Glucose, Bld 104 (H) 70 - 99 mg/dL   BUN 14 6 - 20 mg/dL   Creatinine, Ser 4.33 0.44 - 1.00 mg/dL   Calcium 9.5 8.9 - 29.5 mg/dL   Total Protein 8.2 (H) 6.5 - 8.1 g/dL   Albumin 4.3 3.5 - 5.0 g/dL   AST 20 15 - 41 U/L   ALT 20 0 - 44 U/L   Alkaline Phosphatase 48 38 - 126 U/L   Total Bilirubin 0.3 0.0 - 1.2 mg/dL   GFR, Estimated >18 >84 mL/min   Anion gap 10 5 - 15  CBC with Differential     Status: None   Collection Time: 10/08/23 11:30 AM  Result Value Ref Range   WBC 8.5 4.0 - 10.5 K/uL   RBC 4.86 3.87 - 5.11 MIL/uL   Hemoglobin 14.7 12.0 - 15.0 g/dL   HCT 16.6 06.3 - 01.6 %   MCV 93.2 80.0 - 100.0 fL   MCH 30.2 26.0 - 34.0 pg   MCHC 32.5 30.0 - 36.0 g/dL   RDW 01.0 93.2 - 35.5 %   Platelets 291 150 - 400 K/uL   nRBC 0.0 0.0 - 0.2 %   Neutrophils Relative % 68 %   Neutro Abs 5.8 1.7 - 7.7 K/uL   Lymphocytes Relative 21 %   Lymphs Abs 1.8 0.7 - 4.0 K/uL   Monocytes Relative 9 %   Monocytes Absolute 0.8 0.1 - 1.0 K/uL   Eosinophils Relative 1 %   Eosinophils Absolute 0.1 0.0 - 0.5 K/uL   Basophils Relative 1 %   Basophils Absolute 0.1 0.0 - 0.1 K/uL   Immature Granulocytes 0 %   Abs Immature Granulocytes 0.03 0.00 - 0.07 K/uL   US  Venous Img Lower Unilateral Right Result Date: 10/08/2023 CLINICAL DATA:  Popliteal pain and tenderness. EXAM: Right LOWER EXTREMITY VENOUS DOPPLER ULTRASOUND TECHNIQUE: Gray-scale sonography with compression, as well as color and duplex ultrasound, were performed to evaluate the deep venous system(s) from the level of the common femoral vein through the popliteal and proximal calf veins. COMPARISON:  None Available. FINDINGS: VENOUS Normal compressibility of the common femoral, superficial femoral, and  popliteal veins, as well as the visualized calf veins. Visualized portions of profunda femoral vein and great saphenous vein unremarkable. No filling defects to suggest DVT on grayscale or color Doppler imaging. Doppler waveforms show normal direction of venous flow, normal respiratory plasticity and response to augmentation. Limited views of the contralateral common femoral vein are unremarkable. OTHER Small popliteal fossa cystic focus measuring 4.2  x 1.7 x 1.3 cm. No abnormal flow on Doppler. Limitations: none IMPRESSION: No evidence of right lower extremity DVT. Small popliteal fossa cystic structure. Baker's cyst is possible. Please correlate however with any known history and if needed dedicated workup when appropriate to confirm etiology such as MRI. Electronically Signed   By: Adrianna Horde M.D.   On: 10/08/2023 13:09    ED Clinical Impression  1. Popliteal pain   2. Synovial cyst of right popliteal space      ED Assessment/Plan     Primary concern is DVT versus Baker's cyst.  Doubt arthritis, fracture or dislocation, deferring plain films of the knee.  Will check CBC, CMP in case we had to start her on anticoagulation and get right lower extremity DVT/Baker's cyst ultrasound.  Wells score 2.  Will go ahead and get an ultrasound as it is available today and the D-dimer is a send out lab.  CBC, CMP normal.  Reviewed radiology report.  No evidence of DVT. Baker's cyst possible.  See report for details  Contacted patient with findings of possible Baker's cyst via MyChart, will have staff also call patient and notify her, will have her follow-up with orthopedics for further workup.  Home with Tylenol /ibuprofen , heat or ice, whichever feels better and follow-up with orthopedics.  Discussed labs, imaging, MDM, treatment plan, and plan for follow-up with patient. Discussed sn/sx that should prompt return to the ED. patient agrees with plan.   Spent 45-50 minutes in the care of this  patient  Meds ordered this encounter  Medications   ibuprofen  (ADVIL ) 600 MG tablet    Sig: Take 1 tablet (600 mg total) by mouth every 8 (eight) hours as needed.    Dispense:  30 tablet    Refill:  0      *This clinic note was created using Scientist, clinical (histocompatibility and immunogenetics). Therefore, there may be occasional mistakes despite careful proofreading.  ?    Ethlyn Herd, MD 10/09/23 [phone]

## 2023-10-08 NOTE — ED Triage Notes (Signed)
 Pt presents with right knee pain x 1 week. She reports twisting her knee in the past. It hurts to extend her right leg and feels swollen behind her knee.

## 2023-10-08 NOTE — Discharge Instructions (Addendum)
 I will contact you if there is a blood clot in your leg, or Baker's cyst.  Your CBC, CMP were normal.  Take 600 mg of ibuprofen  combined with 1000 mg of Tylenol  3-4 times a day as needed for pain.  Heat or ice, whichever feels better.  Please follow-up with [facility] in 5 to 7 days.  Go to the ER if your symptoms get worse, you start having chest pain, shortness of breath, fevers above 100.4, redness in your leg, or for other concerns

## 2023-10-17 ENCOUNTER — Encounter

## 2023-10-17 ENCOUNTER — Ambulatory Visit

## 2023-10-17 VITALS — BP 176/81 | Ht 67.0 in | Wt 320.0 lb

## 2023-10-17 DIAGNOSIS — Z309 Encounter for contraceptive management, unspecified: Secondary | ICD-10-CM

## 2023-10-17 DIAGNOSIS — Z3202 Encounter for pregnancy test, result negative: Secondary | ICD-10-CM | POA: Diagnosis not present

## 2023-10-17 LAB — PREGNANCY, URINE: Preg Test, Ur: NEGATIVE

## 2023-10-17 NOTE — Progress Notes (Signed)
 UPT negative. LMP 09/28/23. Last sex 09/18/23. Patient reports having 3 at-home pregnancy tests with faint positive lines. Encouraged patient to take another at-home pregnancy test if she does not have period in May. Patient agreed.  Negative pregnancy packet reviewed and given to patient. All questions answered and verbalizes understanding.  Clare Critchley, RN
